# Patient Record
Sex: Female | Born: 1980 | Race: White | Hispanic: No | Smoking: Former smoker
Health system: Southern US, Community
[De-identification: ages and names within clinical notes are randomized; demographics above are authoritative.]

## PROBLEM LIST (undated history)

## (undated) DIAGNOSIS — D689 Coagulation defect, unspecified: Secondary | ICD-10-CM

## (undated) DIAGNOSIS — Z803 Family history of malignant neoplasm of breast: Secondary | ICD-10-CM

## (undated) DIAGNOSIS — K219 Gastro-esophageal reflux disease without esophagitis: Secondary | ICD-10-CM

## (undated) DIAGNOSIS — E063 Autoimmune thyroiditis: Secondary | ICD-10-CM

## (undated) DIAGNOSIS — Z8041 Family history of malignant neoplasm of ovary: Secondary | ICD-10-CM

## (undated) DIAGNOSIS — E079 Disorder of thyroid, unspecified: Secondary | ICD-10-CM

## (undated) DIAGNOSIS — F32A Depression, unspecified: Secondary | ICD-10-CM

## (undated) DIAGNOSIS — F419 Anxiety disorder, unspecified: Secondary | ICD-10-CM

## (undated) DIAGNOSIS — I10 Essential (primary) hypertension: Secondary | ICD-10-CM

## (undated) DIAGNOSIS — Z800 Family history of malignant neoplasm of digestive organs: Secondary | ICD-10-CM

## (undated) HISTORY — DX: Family history of malignant neoplasm of digestive organs: Z80.0

## (undated) HISTORY — DX: Coagulation defect, unspecified: D68.9

## (undated) HISTORY — DX: Depression, unspecified: F32.A

## (undated) HISTORY — DX: Anxiety disorder, unspecified: F41.9

## (undated) HISTORY — DX: Family history of malignant neoplasm of breast: Z80.3

## (undated) HISTORY — DX: Gastro-esophageal reflux disease without esophagitis: K21.9

## (undated) HISTORY — DX: Family history of malignant neoplasm of ovary: Z80.41

---

## 2005-07-28 ENCOUNTER — Emergency Department: Payer: Self-pay | Admitting: Emergency Medicine

## 2005-07-30 ENCOUNTER — Observation Stay: Payer: Self-pay | Admitting: Urology

## 2005-08-09 ENCOUNTER — Emergency Department: Payer: Self-pay | Admitting: Emergency Medicine

## 2012-01-28 ENCOUNTER — Emergency Department: Payer: Self-pay | Admitting: Emergency Medicine

## 2012-01-28 LAB — COMPREHENSIVE METABOLIC PANEL
Bilirubin,Total: 0.2 mg/dL (ref 0.2–1.0)
Calcium, Total: 9 mg/dL (ref 8.5–10.1)
Chloride: 103 mmol/L (ref 98–107)
Co2: 26 mmol/L (ref 21–32)
Creatinine: 0.85 mg/dL (ref 0.60–1.30)
EGFR (African American): >60
EGFR (Non-African Amer.): >60
Glucose: 108 mg/dL — ABNORMAL HIGH (ref 65–99)
Osmolality: 277 (ref 275–301)
Sodium: 138 mmol/L (ref 136–145)

## 2012-01-28 LAB — CBC
HGB: 13.4 g/dL (ref 12.0–16.0)
MCV: 93 fL (ref 80–100)
Platelet: 232 10*3/uL (ref 150–440)
RBC: 4.11 10*6/uL (ref 3.80–5.20)
RDW: 13.8 % (ref 11.5–14.5)
WBC: 10.6 10*3/uL (ref 3.6–11.0)

## 2012-01-28 LAB — CK TOTAL AND CKMB (NOT AT ARMC): CK, Total: 152 U/L (ref 21–215)

## 2012-01-28 LAB — URINALYSIS, COMPLETE
Bilirubin,UR: NEGATIVE
Glucose,UR: NEGATIVE mg/dL (ref 0–75)
Ketone: NEGATIVE
Leukocyte Esterase: NEGATIVE
Nitrite: NEGATIVE
Ph: 6 (ref 4.5–8.0)
Protein: NEGATIVE
RBC,UR: 1 /HPF (ref 0–5)
Squamous Epithelial: 2

## 2013-11-16 ENCOUNTER — Emergency Department: Payer: Self-pay | Admitting: Emergency Medicine

## 2013-11-16 LAB — CBC
HCT: 43.3 % (ref 35.0–47.0)
HGB: 14.1 g/dL (ref 12.0–16.0)
MCH: 29.2 pg (ref 26.0–34.0)
MCHC: 32.6 g/dL (ref 32.0–36.0)
MCV: 90 fL (ref 80–100)
PLATELETS: 254 10*3/uL (ref 150–440)
RBC: 4.83 10*6/uL (ref 3.80–5.20)
RDW: 13.2 % (ref 11.5–14.5)
WBC: 11 10*3/uL (ref 3.6–11.0)

## 2013-11-16 LAB — COMPREHENSIVE METABOLIC PANEL
ALT: 30 U/L
Albumin: 3.9 g/dL (ref 3.4–5.0)
Alkaline Phosphatase: 112 U/L
Anion Gap: 10 (ref 7–16)
BUN: 13 mg/dL (ref 7–18)
Bilirubin,Total: 0.4 mg/dL (ref 0.2–1.0)
CALCIUM: 9.5 mg/dL (ref 8.5–10.1)
CREATININE: 0.65 mg/dL (ref 0.60–1.30)
Chloride: 105 mmol/L (ref 98–107)
Co2: 22 mmol/L (ref 21–32)
GLUCOSE: 95 mg/dL (ref 65–99)
Osmolality: 274 (ref 275–301)
Potassium: 4 mmol/L (ref 3.5–5.1)
SGOT(AST): 13 U/L — ABNORMAL LOW (ref 15–37)
Sodium: 137 mmol/L (ref 136–145)
Total Protein: 7.9 g/dL (ref 6.4–8.2)

## 2013-11-16 LAB — TROPONIN I: Troponin-I: <0.02 ng/mL

## 2013-11-16 LAB — LIPASE, BLOOD: Lipase: 94 U/L (ref 73–393)

## 2015-08-05 ENCOUNTER — Encounter: Payer: Self-pay | Admitting: Emergency Medicine

## 2015-08-05 ENCOUNTER — Emergency Department: Payer: Managed Care, Other (non HMO)

## 2015-08-05 ENCOUNTER — Emergency Department
Admission: EM | Admit: 2015-08-05 | Discharge: 2015-08-05 | Disposition: A | Discharge status: Home / Self Care | Payer: Managed Care, Other (non HMO) | Attending: Emergency Medicine | Admitting: Emergency Medicine

## 2015-08-05 DIAGNOSIS — F17200 Nicotine dependence, unspecified, uncomplicated: Secondary | ICD-10-CM | POA: Diagnosis not present

## 2015-08-05 DIAGNOSIS — R519 Headache, unspecified: Secondary | ICD-10-CM

## 2015-08-05 DIAGNOSIS — R51 Headache: Secondary | ICD-10-CM | POA: Diagnosis not present

## 2015-08-05 DIAGNOSIS — I10 Essential (primary) hypertension: Secondary | ICD-10-CM | POA: Diagnosis not present

## 2015-08-05 HISTORY — DX: Disorder of thyroid, unspecified: E07.9

## 2015-08-05 HISTORY — DX: Essential (primary) hypertension: I10

## 2015-08-05 LAB — BASIC METABOLIC PANEL
Anion gap: 6 (ref 5–15)
BUN: 11 mg/dL (ref 6–20)
CHLORIDE: 105 mmol/L (ref 101–111)
CO2: 27 mmol/L (ref 22–32)
Calcium: 9.2 mg/dL (ref 8.9–10.3)
Creatinine, Ser: 0.58 mg/dL (ref 0.44–1.00)
Glucose, Bld: 111 mg/dL — ABNORMAL HIGH (ref 65–99)
POTASSIUM: 4.1 mmol/L (ref 3.5–5.1)
SODIUM: 138 mmol/L (ref 135–145)

## 2015-08-05 LAB — CBC
HCT: 41.8 % (ref 35.0–47.0)
Hemoglobin: 14.1 g/dL (ref 12.0–16.0)
MCH: 30.1 pg (ref 26.0–34.0)
MCHC: 33.8 g/dL (ref 32.0–36.0)
MCV: 89.1 fL (ref 80.0–100.0)
PLATELETS: 206 10*3/uL (ref 150–440)
RBC: 4.69 MIL/uL (ref 3.80–5.20)
RDW: 13.4 % (ref 11.5–14.5)
WBC: 9.2 10*3/uL (ref 3.6–11.0)

## 2015-08-05 LAB — URINALYSIS COMPLETE WITH MICROSCOPIC (ARMC ONLY)
BILIRUBIN URINE: NEGATIVE
Bacteria, UA: NONE SEEN
Glucose, UA: NEGATIVE mg/dL
KETONES UR: NEGATIVE mg/dL
Leukocytes, UA: NEGATIVE
Nitrite: NEGATIVE
Protein, ur: NEGATIVE mg/dL
Specific Gravity, Urine: 1.015 (ref 1.005–1.030)
WBC, UA: NONE SEEN WBC/hpf (ref 0–5)
pH: 6 (ref 5.0–8.0)

## 2015-08-05 LAB — PREGNANCY, URINE: Preg Test, Ur: NEGATIVE

## 2015-08-05 MED ORDER — DIPHENHYDRAMINE HCL 50 MG/ML IJ SOLN
25.0000 mg | Freq: Once | INTRAMUSCULAR | Status: AC
  Administered 2015-08-05: 25 mg via INTRAVENOUS
  Filled 2015-08-05: qty 1

## 2015-08-05 MED ORDER — SODIUM CHLORIDE 0.9 % IV BOLUS (SEPSIS)
500.0000 mL | Freq: Once | INTRAVENOUS | Status: AC
  Administered 2015-08-05: 500 mL via INTRAVENOUS

## 2015-08-05 MED ORDER — KETOROLAC TROMETHAMINE 30 MG/ML IJ SOLN
30.0000 mg | Freq: Once | INTRAMUSCULAR | Status: AC
  Administered 2015-08-05: 30 mg via INTRAVENOUS
  Filled 2015-08-05: qty 1

## 2015-08-05 NOTE — ED Provider Notes (Signed)
[facility] Emergency Department Provider Note   ____________________________________________  Time seen:  I have reviewed the triage vital signs and the triage nursing note.  HISTORY  Chief Complaint Headache   Historian Patient  HPI Kelsey Elbert EwingsL Lelon PerlaSaunders is a 35 y.o. female is here for complaint of hyper or pressure and headaches. She states that she's been having some frequent headaches over the past couple weeks, this seemed to be associated with high blood pressure. She states it starts with dizziness and then she checks her blood pressure and high in the 180 systolic range. Headaches are global and frontal in nature. She states occasionally she feels a little off balance, but no weakness or numbness confusion or altered mental status now.  She is followed for hypothyroid and hypertension with her primary care physician. She states that she takes labetalol at home.  Pain is mild to moderate not associated with any specific alleviating or exacerbating factors. Denies major stressors.    Past Medical History  Diagnosis Date  . Thyroid disease   . Hypertension     There are no active problems to display for this patient.   History reviewed. No pertinent past surgical history.  No current outpatient prescriptions on file. Labetalol, thyroid medication  Allergies Review of patient's allergies indicates no known allergies.  History reviewed. No pertinent family history.  Social History Social History  Substance Use Topics  . Smoking status: Current Every Day Smoker  . Smokeless tobacco: None  . Alcohol Use: Yes    Review of Systems  Constitutional: Negative for fever. Eyes: Negative for visual changes. ENT: Negative for sore throat. Cardiovascular: Negative for chest pain. Respiratory: Negative for shortness of breath. Gastrointestinal: Negative for abdominal pain, vomiting and diarrhea. Genitourinary: Negative for dysuria. Musculoskeletal:  Negative for back pain. Skin: Negative for rash. Neurological: Positive for headache. 10 point Review of Systems otherwise negative ____________________________________________   PHYSICAL EXAM:  VITAL SIGNS: ED Triage Vitals  Enc Vitals Group     BP 08/05/15 1036 155/99 mmHg     Pulse Rate 08/05/15 1036 77     Resp 08/05/15 1036 20     Temp 08/05/15 1036 98 F (36.7 C)     Temp Source 08/05/15 1036 Oral     SpO2 08/05/15 1036 100 %     Weight 08/05/15 1036 225 lb (102.059 kg)     Height 08/05/15 1036 5\' 8"  (1.727 m)     Head Cir --      Peak Flow --      Pain Score 08/05/15 1036 7     Pain Loc --      Pain Edu? --      Excl. in GC? --      Constitutional: Alert and oriented. Well appearing and in no distress. HEENT   Head: Normocephalic and atraumatic.      Eyes: Conjunctivae are normal. PERRL. Normal extraocular movements.      Ears:         Nose: No congestion/rhinnorhea.   Mouth/Throat: Mucous membranes are moist.   Neck: No stridor. Cardiovascular/Chest: Normal rate, regular rhythm.  No murmurs, rubs, or gallops. Respiratory: Normal respiratory effort without tachypnea nor retractions. Breath sounds are clear and equal bilaterally. No wheezes/rales/rhonchi. Gastrointestinal: Soft. No distention, no guarding, no rebound. Nontender.    Genitourinary/rectal:Deferred Musculoskeletal: Nontender with normal range of motion in all extremities. No joint effusions.  No lower extremity tenderness.  No edema. Neurologic:  Normal speech and language. No gross  or focal neurologic deficits are appreciated. Skin:  Skin is warm, dry and intact. No rash noted. Psychiatric: Mood and affect are normal. Speech and behavior are normal. Patient exhibits appropriate insight and judgment.  ____________________________________________   EKG I, Governor Rooks, MD, the attending physician have personally viewed and interpreted all ECGs.  70 bpm. Normal sinus rhythm. Narrow QRS.  Normal axis. Normal ST and T-wave ____________________________________________  LABS (pertinent positives/negatives)  BNP within normal limits CBC within normal limits Urinalysis without significant abnormality Urine pregnancy test negative  ____________________________________________  RADIOLOGY All Xrays were viewed by me. Imaging interpreted by Radiologist.  CT without contrast: Normal CT of the brain without intravenous contrast __________________________________________  PROCEDURES  Procedure(s) performed: None  Critical Care performed: None  ____________________________________________   ED COURSE / ASSESSMENT AND PLAN  Pertinent labs & imaging results that were available during my care of the patient were reviewed by me and considered in my medical decision making (see chart for details).  Patient's here for complaint of headache with high blood pressure, her blood pressures are a little elevated here but not severely so. It's unclear to me whether or not she is having spikes of high blood pressure that cause her symptomatic headaches, or alternatively if she's having headaches that are causing elevated blood pressures. Patient herself thinks that she's having spikes of high blood pressure and I discussed with her she could take an extra blood pressure pill although I'm a little concerned about creating hypotension given that she is reporting at times normal blood pressure of 120/80.  In terms of today, she's not having a focal neurologic finding on exam, but reports occasional dizziness and I discussed neuroimaging either today with head CT with the risk versus benefit ratio discussed, versus outpatient MRI of the brain.  Patient states she does want proceed with head CT today. She denies having prior neuroimaging in the past.  I treated her with IV Toradol, Benadryl, and IV fluids. After this she was symptomatically improved. Her blood pressures down to 120/80. Her  head CT is reassuring and unremarkable.  Patient is okay for outpatient discharge and follow-up at this point time.   CONSULTATIONS:   None   Patient / Family / Caregiver informed of clinical course, medical decision-making process, and agree with plan.   I discussed return precautions, follow-up instructions, and discharged instructions with patient and/or family.   ___________________________________________   FINAL CLINICAL IMPRESSION(S) / ED DIAGNOSES   Final diagnoses:  Acute nonintractable headache, unspecified headache type              Note: This dictation was prepared with Dragon dictation. Any transcriptional errors that result from this process are unintentional   Governor Rooks, MD 08/05/15 1624

## 2015-08-05 NOTE — Discharge Instructions (Signed)
You were evaluated for headache and although no certain cause was found, your exam and evaluation are reassuring in the emergency department stay.  Return to the emergency department for any worsening condition including confusion or altered mental status, blood pressure greater than 240/120, weakness, numbness, or any other symptoms concerning to you.  We discussed you may try 600 mg ibuprofen and 25 mg of Benadryl over-the-counter at onset of headache. Alternatively, we discussed she might try an extra dose of your blood pressure medication if your blood pressure is greater than 180 systolic/top number.   General Headache Without Cause A headache is pain or discomfort felt around the head or neck area. There are many causes and types of headaches. In some cases, the cause may not be found.  HOME CARE  Managing Pain  Take over-the-counter and prescription medicines only as told by your doctor.  Lie down in a dark, quiet room when you have a headache.  If directed, apply ice to the head and neck area:  Put ice in a plastic bag.  Place a towel between your skin and the bag.  Leave the ice on for 20 minutes, 2-3 times per day.  Use a heating pad or hot shower to apply heat to the head and neck area as told by your doctor.  Keep lights dim if bright lights bother you or make your headaches worse. Eating and Drinking  Eat meals on a regular schedule.  Lessen how much alcohol you drink.  Lessen how much caffeine you drink, or stop drinking caffeine. General Instructions  Keep all follow-up visits as told by your doctor. This is important.  Keep a journal to find out if certain things bring on headaches. For example, write down:  What you eat and drink.  How much sleep you get.  Any change to your diet or medicines.  Relax by getting a massage or doing other relaxing activities.  Lessen stress.  Sit up straight. Do not tighten (tense) your muscles.  Do not use tobacco  products. This includes cigarettes, chewing tobacco, or e-cigarettes. If you need help quitting, ask your doctor.  Exercise regularly as told by your doctor.  Get enough sleep. This often means 7-9 hours of sleep. GET HELP IF:  Your symptoms are not helped by medicine.  You have a headache that feels different than the other headaches.  You feel sick to your stomach (nauseous) or you throw up (vomit).  You have a fever. GET HELP RIGHT AWAY IF:   Your headache becomes really bad.  You keep throwing up.  You have a stiff neck.  You have trouble seeing.  You have trouble speaking.  You have pain in the eye or ear.  Your muscles are weak or you lose muscle control.  You lose your balance or have trouble walking.  You feel like you will pass out (faint) or you pass out.  You have confusion.   This information is not intended to replace advice given to you by your health care provider. Make sure you discuss any questions you have with your health care provider.   Document Released: 12/16/2007 Document Revised: 11/27/2014 Document Reviewed: 07/01/2014 Elsevier Interactive Patient Education [employer]2016 Elsevier Inc.

## 2015-08-05 NOTE — ED Notes (Signed)
Pt to ed with c/o headache, blurred vision, dizziness since yesterday.  Pt states checked her bp at work today and it was 144/100

## 2015-08-05 NOTE — ED Notes (Signed)
Patient sleeping soundly

## 2015-08-05 NOTE — ED Notes (Signed)
See triage note. Patient c/o headache that began yesterday to frontal lobe. Patient states she has h/o HTN and has been taking medications as directed, continues to be mildly hypertensive in ER.

## 2017-07-29 ENCOUNTER — Other Ambulatory Visit: Payer: Self-pay

## 2017-07-29 ENCOUNTER — Emergency Department
Admission: EM | Admit: 2017-07-29 | Discharge: 2017-07-29 | Disposition: A | Discharge status: Home / Self Care | Payer: 59 | Attending: Emergency Medicine | Admitting: Emergency Medicine

## 2017-07-29 ENCOUNTER — Encounter: Payer: Self-pay | Admitting: Emergency Medicine

## 2017-07-29 DIAGNOSIS — R51 Headache: Secondary | ICD-10-CM | POA: Diagnosis present

## 2017-07-29 DIAGNOSIS — Z87891 Personal history of nicotine dependence: Secondary | ICD-10-CM | POA: Insufficient documentation

## 2017-07-29 DIAGNOSIS — I10 Essential (primary) hypertension: Secondary | ICD-10-CM | POA: Insufficient documentation

## 2017-07-29 DIAGNOSIS — R519 Headache, unspecified: Secondary | ICD-10-CM

## 2017-07-29 MED ORDER — PROCHLORPERAZINE EDISYLATE 10 MG/2ML IJ SOLN
10.0000 mg | Freq: Once | INTRAMUSCULAR | Status: AC
  Administered 2017-07-29: 10 mg via INTRAVENOUS
  Filled 2017-07-29: qty 2

## 2017-07-29 MED ORDER — SODIUM CHLORIDE 0.9 % IV BOLUS
1000.0000 mL | Freq: Once | INTRAVENOUS | Status: AC
  Administered 2017-07-29: 1000 mL via INTRAVENOUS

## 2017-07-29 NOTE — Discharge Instructions (Addendum)
Please seek medical attention for any high fevers, chest pain, shortness of breath, change in behavior, persistent vomiting, bloody stool or any other new or concerning symptoms.  

## 2017-07-29 NOTE — ED Triage Notes (Signed)
Pt to ED via POV c/o headache and hypertension. Pt states that she has had a headache since yesterday. Pt denies missing any of her blood pressure medications. Pt is not currently hypertensive in triage. Pt states that she has also had some numbness across her forehead and down the right side of her face as well as changes in vision in the right eye. No facial droop noted. Pt states that the numbness and visual changes started yesterday. Pt is in NAD at this time.

## 2017-07-29 NOTE — ED Provider Notes (Signed)
[facility] Emergency Department Provider Note _________________________________________   I have reviewed the triage vital signs and the nursing notes.   HISTORY  Chief Complaint Headache and Hypertension   History limited by: Not Limited   HPI Kelsey Reed is a 37 y.o. female who presents to the emergency department today with complaints of headache and high blood pressure. Patient states it started yesterday when she felt like her blood pressure was increasing. She felt lightheaded and developed a right sided headache. Checked her blood pressure at the time and it showed systolic blood pressure in the 140s and diastolic in the 100s. She states that since then the headache has been constant. She tried tylenol and ibuprofen at home without any significant relief. She also has complaints of right sided facial numbness, bilateral forehead numbness, right sided blurry vision and right sided weakness. She denies any trauma to her head.   Per medical record review patient has a history of ED visit for headache in the past with negative neuroimaging at that time.   Past Medical History:  Diagnosis Date  . Hypertension   . Thyroid disease     There are no active problems to display for this patient.   History reviewed. No pertinent surgical history.  Prior to Admission medications   Not on File    Allergies Patient has no known allergies.  No family history on file.  Social History Social History   Tobacco Use  . Smoking status: Former Games developer  . Smokeless tobacco: Never Used  Substance Use Topics  . Alcohol use: Yes    Alcohol/week: 7.2 oz    Types: 12 Cans of beer per week  . Drug use: No    Review of Systems Constitutional: No fever/chills Eyes: Positive for right sided blurry vision. ENT: No sore throat. Cardiovascular: Denies chest pain. Respiratory: Denies shortness of breath. Gastrointestinal: No abdominal pain.  No nausea, no  vomiting.  No diarrhea.   Genitourinary: Negative for dysuria. Musculoskeletal: Negative for back pain. Skin: Negative for rash. Neurological: Positive for headache, right sided weakness, right facial numbness, bilateral forehead numbness ____________________________________________   PHYSICAL EXAM:  VITAL SIGNS: ED Triage Vitals [07/29/17 1424]  Enc Vitals Group     BP (!) 114/99     Pulse Rate 83     Resp 16     Temp 98.4 F (36.9 C)     Temp Source Oral     SpO2 99 %     Weight 235 lb (106.6 kg)     Height  (1.727 m)     Head Circumference      Peak Flow      Pain Score 6   Constitutional: Alert and oriented. Well appearing and in no distress. Eyes: Conjunctivae are normal.  ENT   Head: Normocephalic and atraumatic.   Nose: No congestion/rhinnorhea.   Mouth/Throat: Mucous membranes are moist.   Neck: No stridor. Hematological/Lymphatic/Immunilogical: No cervical lymphadenopathy. Cardiovascular: Normal rate, regular rhythm.  No murmurs, rubs, or gallops. Respiratory: Normal respiratory effort without tachypnea nor retractions. Breath sounds are clear and equal bilaterally. No wheezes/rales/rhonchi. Gastrointestinal: Soft and non tender. No rebound. No guarding.  Genitourinary: Deferred Musculoskeletal: Normal range of motion in all extremities. No lower extremity edema. Neurologic:  Normal speech and language. Tongue midline. Bilateral palate elevation. Subjective decrease in sensation to light touch over bilateral forehead and right face. Face symmetric. Strength 4+/5 in right upper and lower extremities.  Skin:  Skin is warm,  dry and intact. No rash noted. Psychiatric: Mood and affect are normal. Speech and behavior are normal. Patient exhibits appropriate insight and judgment.  ____________________________________________    LABS (pertinent  positives/negatives)  None  ____________________________________________   EKG  None  ____________________________________________    RADIOLOGY  None  ____________________________________________   PROCEDURES  Procedures  ____________________________________________   INITIAL IMPRESSION / ASSESSMENT AND PLAN / ED COURSE  Pertinent labs & imaging results that were available during my care of the patient were reviewed by me and considered in my medical decision making (see chart for details).  Patient presented to the emergency department today with concerns for bad headache as well as some numbness.  Given the distribution of the numbness there would be very unlikely to be due to a stroke given bilateral forehead.  Additionally be a large stroke given complete right-sided numbness.  However I doubt stroke.  Do think more likely complicated headache and migraine.  Patient did feel better after Compazine and said that her numbness was improving as well.  She did feel comfortable and desired to be discharged home.  Will give patient neurology follow-up for headaches.  ____________________________________________   FINAL CLINICAL IMPRESSION(S) / ED DIAGNOSES  Final diagnoses:  Bad headache  Hypertension, unspecified type     Note: This dictation was prepared with Dragon dictation. Any transcriptional errors that result from this process are unintentional     Phineas Semen, MD 07/29/17 1700

## 2017-11-04 DIAGNOSIS — F329 Major depressive disorder, single episode, unspecified: Secondary | ICD-10-CM | POA: Insufficient documentation

## 2017-11-04 DIAGNOSIS — N920 Excessive and frequent menstruation with regular cycle: Secondary | ICD-10-CM | POA: Insufficient documentation

## 2019-01-02 DIAGNOSIS — Z975 Presence of (intrauterine) contraceptive device: Secondary | ICD-10-CM | POA: Insufficient documentation

## 2019-01-15 ENCOUNTER — Emergency Department: Payer: BC Managed Care – PPO

## 2019-01-15 ENCOUNTER — Other Ambulatory Visit: Payer: Self-pay

## 2019-01-15 ENCOUNTER — Emergency Department
Admission: EM | Admit: 2019-01-15 | Discharge: 2019-01-15 | Disposition: A | Discharge status: Home / Self Care | Payer: BC Managed Care – PPO | Attending: Emergency Medicine | Admitting: Emergency Medicine

## 2019-01-15 ENCOUNTER — Encounter: Payer: Self-pay | Admitting: Emergency Medicine

## 2019-01-15 DIAGNOSIS — Z87891 Personal history of nicotine dependence: Secondary | ICD-10-CM | POA: Insufficient documentation

## 2019-01-15 DIAGNOSIS — R519 Headache, unspecified: Secondary | ICD-10-CM | POA: Diagnosis present

## 2019-01-15 DIAGNOSIS — R079 Chest pain, unspecified: Secondary | ICD-10-CM | POA: Diagnosis not present

## 2019-01-15 DIAGNOSIS — I10 Essential (primary) hypertension: Secondary | ICD-10-CM | POA: Diagnosis not present

## 2019-01-15 HISTORY — DX: Autoimmune thyroiditis: E06.3

## 2019-01-15 LAB — CBC
HCT: 41.6 % (ref 36.0–46.0)
Hemoglobin: 13.8 g/dL (ref 12.0–15.0)
MCH: 29.8 pg (ref 26.0–34.0)
MCHC: 33.2 g/dL (ref 30.0–36.0)
MCV: 89.8 fL (ref 80.0–100.0)
Platelets: 261 10*3/uL (ref 150–400)
RBC: 4.63 MIL/uL (ref 3.87–5.11)
RDW: 12.5 % (ref 11.5–15.5)
WBC: 10.4 10*3/uL (ref 4.0–10.5)
nRBC: 0 % (ref 0.0–0.2)

## 2019-01-15 LAB — BASIC METABOLIC PANEL
Anion gap: 12 (ref 5–15)
BUN: 11 mg/dL (ref 6–20)
CO2: 21 mmol/L — ABNORMAL LOW (ref 22–32)
Calcium: 9.3 mg/dL (ref 8.9–10.3)
Chloride: 104 mmol/L (ref 98–111)
Creatinine, Ser: 0.56 mg/dL (ref 0.44–1.00)
GFR calc Af Amer: >60 mL/min (ref >60)
GFR calc non Af Amer: >60 mL/min (ref >60)
Glucose, Bld: 97 mg/dL (ref 70–99)
Potassium: 4.2 mmol/L (ref 3.5–5.1)
Sodium: 137 mmol/L (ref 135–145)

## 2019-01-15 LAB — TROPONIN I (HIGH SENSITIVITY)
Troponin I (High Sensitivity): 3 ng/L (ref <18)
Troponin I (High Sensitivity): <2 ng/L (ref <18)

## 2019-01-15 MED ORDER — SODIUM CHLORIDE 0.9% FLUSH: 3.0000 mL | Freq: Once | INTRAVENOUS | Status: DC

## 2019-01-15 MED ORDER — BUTALBITAL-APAP-CAFFEINE 50-325-40 MG PO TABS
1.0000 | ORAL_TABLET | Freq: Once | ORAL | Status: AC
  Administered 2019-01-15: 1 via ORAL
  Filled 2019-01-15: qty 1

## 2019-01-15 MED ORDER — BUTALBITAL-APAP-CAFFEINE 50-325-40 MG PO TABS: 1.0000 | ORAL_TABLET | Freq: Four times a day (QID) | ORAL | 0 refills | Status: AC | PRN

## 2019-01-15 MED ORDER — LIDOCAINE VISCOUS HCL 2 % MT SOLN
15.0000 mL | Freq: Once | OROMUCOSAL | Status: AC
  Administered 2019-01-15: 15 mL via OROMUCOSAL
  Filled 2019-01-15: qty 15

## 2019-01-15 NOTE — ED Provider Notes (Signed)
[facility] Emergency Department Provider Note   ____________________________________________   I have reviewed the triage vital signs and the nursing notes.   HISTORY  Chief Complaint Chest Pain and Headache   History limited by: Not Limited   HPI Kelsey Reed is a 38 y.o. female who presents to the emergency department today with complaints of headache and chest pain.  Patient states that headache started roughly 3 or 4 days ago.  Has been constant.  She describes it as being somewhat severe.  Starting yesterday evening she developed chest pain.  She describes it being located in the center and left chest.  She describes it as sharp.  It does radiate to her back.  Has been constant.  She has had some slight associated shortness of breath.  She denies any cough.  Denies pain similar to this in the past.  No recent significant alcohol use or over-the-counter pain medication use.  Patient denies any recent travel.  No history of blood clots.  Patient has Mirena IUD.   Records reviewed. Per medical record review patient has a history of hypertension, thyroid disease.   Past Medical History:  Diagnosis Date  . Hashimoto's disease   . Hypertension   . Thyroid disease     There are no active problems to display for this patient.   History reviewed. No pertinent surgical history.  Prior to Admission medications   Not on File    Allergies Patient has no known allergies.  History reviewed. No pertinent family history.  Social History Social History   Tobacco Use  . Smoking status: Former Research scientist (life sciences)  . Smokeless tobacco: Never Used  Substance Use Topics  . Alcohol use: Yes    Alcohol/week: 12.0 standard drinks    Types: 12 Cans of beer per week  . Drug use: No    Review of Systems Constitutional: No fever/chills Eyes: No visual changes. ENT: No sore throat. Cardiovascular: Positive for chest pain. Respiratory: Positive for shortness of  breath. Gastrointestinal: No abdominal pain.  No nausea, no vomiting.  No diarrhea.   Genitourinary: Negative for dysuria. Musculoskeletal: Negative for back pain. Skin: Negative for rash. Neurological: Positive for headache.  ____________________________________________   PHYSICAL EXAM:  VITAL SIGNS: ED Triage Vitals [01/15/19 1136]  Enc Vitals Group     BP (!) 149/93     Pulse Rate 89     Resp 18     Temp 98.2 F (36.8 C)     Temp Source Oral     SpO2 97 %     Weight 253 lb (114.8 kg)     Height 5\' 8"  (1.727 m)     Head Circumference      Peak Flow      Pain Score 5   Constitutional: Alert and oriented.  Eyes: Conjunctivae are normal.  ENT      Head: Normocephalic and atraumatic.      Nose: No congestion/rhinnorhea.      Mouth/Throat: Mucous membranes are moist.      Neck: No stridor. Hematological/Lymphatic/Immunilogical: No cervical lymphadenopathy. Cardiovascular: Normal rate, regular rhythm.  No murmurs, rubs, or gallops.  Respiratory: Normal respiratory effort without tachypnea nor retractions. Breath sounds are clear and equal bilaterally. No wheezes/rales/rhonchi. Gastrointestinal: Soft and non tender. No rebound. No guarding.  Genitourinary: Deferred Musculoskeletal: Normal range of motion in all extremities. No lower extremity edema. Neurologic:  Normal speech and language. No gross focal neurologic deficits are appreciated.  Skin:  Skin is warm,  dry and intact. No rash noted. Psychiatric: Mood and affect are normal. Speech and behavior are normal. Patient exhibits appropriate insight and judgment.  ____________________________________________    LABS (pertinent positives/negatives)  Trop 3 -> <2 BMP wnl except co2 21 CBC wbc 10.4, hgb 13.8, plt 261  ____________________________________________   EKG  I, Phineas Semen, attending physician, personally viewed and interpreted this EKG  EKG Time: 1135 Rate: 83 Rhythm: normal sinus rhythm Axis:  normal Intervals: qtc 439 QRS: narrow ST changes: no st elevation, t wave inversion III, aVF Impression: abnormal ekg  Previous EKGs reviewed, EKGs dated from 2017 and 2015 with t wave inversion in III, t wave flattening in aVF  ____________________________________________    RADIOLOGY  CXR No active disease  ____________________________________________   PROCEDURES  Procedures  ____________________________________________   INITIAL IMPRESSION / ASSESSMENT AND PLAN / ED COURSE  Pertinent labs & imaging results that were available during my care of the patient were reviewed by me and considered in my medical decision making (see chart for details).   Patient presented to the emergency department today because of concern for chest pain and headache. In terms of the chest pain, CXR without pna or ptx. Troponin negative times 2. Doubt PE given lack of tachycardia, cough or hypoxia. Headache did improve with medication. Patient did felt comfortable and requested discharge after headache resolved. I do think this is reasonable. Will have patient follow up.   ____________________________________________   FINAL CLINICAL IMPRESSION(S) / ED DIAGNOSES  Final diagnoses:  Bad headache  Nonspecific chest pain     Note: This dictation was prepared with Dragon dictation. Any transcriptional errors that result from this process are unintentional     Phineas Semen, MD 01/15/19 [phone]

## 2019-01-15 NOTE — ED Triage Notes (Addendum)
Brought by [facility] for htn, chest pain (since last night)  Dizziness and headache.

## 2019-01-15 NOTE — ED Triage Notes (Signed)
Pt presents to ED via wheelchair from [facility] with c/o L sided chest pain that radiates to her L shoulder/neck. Pt also c/o HA and dizziness that started last night. Pt states was at work, had them check her BP, and her BP was 144/100 at work.

## 2019-01-15 NOTE — Discharge Instructions (Addendum)
Please seek medical attention for any high fevers, chest pain, shortness of breath, change in behavior, persistent vomiting, bloody stool or any other new or concerning symptoms.  

## 2020-05-29 ENCOUNTER — Other Ambulatory Visit: Payer: Self-pay

## 2020-05-29 ENCOUNTER — Emergency Department
Admission: EM | Admit: 2020-05-29 | Discharge: 2020-05-29 | Disposition: A | Discharge status: Home / Self Care | Payer: Managed Care, Other (non HMO) | Attending: Emergency Medicine | Admitting: Emergency Medicine

## 2020-05-29 ENCOUNTER — Emergency Department: Payer: Managed Care, Other (non HMO)

## 2020-05-29 ENCOUNTER — Encounter: Payer: Self-pay | Admitting: Emergency Medicine

## 2020-05-29 DIAGNOSIS — R630 Anorexia: Secondary | ICD-10-CM | POA: Insufficient documentation

## 2020-05-29 DIAGNOSIS — I10 Essential (primary) hypertension: Secondary | ICD-10-CM | POA: Insufficient documentation

## 2020-05-29 DIAGNOSIS — R1013 Epigastric pain: Secondary | ICD-10-CM | POA: Diagnosis not present

## 2020-05-29 DIAGNOSIS — R109 Unspecified abdominal pain: Secondary | ICD-10-CM | POA: Diagnosis present

## 2020-05-29 DIAGNOSIS — Z87891 Personal history of nicotine dependence: Secondary | ICD-10-CM | POA: Diagnosis not present

## 2020-05-29 DIAGNOSIS — N12 Tubulo-interstitial nephritis, not specified as acute or chronic: Secondary | ICD-10-CM

## 2020-05-29 LAB — CBC WITH DIFFERENTIAL/PLATELET
Abs Immature Granulocytes: 0.08 10*3/uL — ABNORMAL HIGH (ref 0.00–0.07)
Basophils Absolute: 0.1 10*3/uL (ref 0.0–0.1)
Basophils Relative: 1 %
Eosinophils Absolute: 0.4 10*3/uL (ref 0.0–0.5)
Eosinophils Relative: 4 %
HCT: 41.4 % (ref 36.0–46.0)
Hemoglobin: 14.4 g/dL (ref 12.0–15.0)
Immature Granulocytes: 1 %
Lymphocytes Relative: 30 %
Lymphs Abs: 3 10*3/uL (ref 0.7–4.0)
MCH: 31.2 pg (ref 26.0–34.0)
MCHC: 34.8 g/dL (ref 30.0–36.0)
MCV: 89.6 fL (ref 80.0–100.0)
Monocytes Absolute: 0.6 10*3/uL (ref 0.1–1.0)
Monocytes Relative: 6 %
Neutro Abs: 5.8 10*3/uL (ref 1.7–7.7)
Neutrophils Relative %: 58 %
Platelets: 228 10*3/uL (ref 150–400)
RBC: 4.62 MIL/uL (ref 3.87–5.11)
RDW: 12.9 % (ref 11.5–15.5)
WBC: 9.9 10*3/uL (ref 4.0–10.5)
nRBC: 0 % (ref 0.0–0.2)

## 2020-05-29 LAB — URINALYSIS, COMPLETE (UACMP) WITH MICROSCOPIC
Bacteria, UA: NONE SEEN
Bilirubin Urine: NEGATIVE
Glucose, UA: NEGATIVE mg/dL
Ketones, ur: NEGATIVE mg/dL
Nitrite: NEGATIVE
Protein, ur: NEGATIVE mg/dL
Specific Gravity, Urine: 1.017 (ref 1.005–1.030)
pH: 6 (ref 5.0–8.0)

## 2020-05-29 LAB — COMPREHENSIVE METABOLIC PANEL
ALT: 15 U/L (ref 0–44)
AST: 19 U/L (ref 15–41)
Albumin: 3.9 g/dL (ref 3.5–5.0)
Alkaline Phosphatase: 76 U/L (ref 38–126)
Anion gap: 7 (ref 5–15)
BUN: 20 mg/dL (ref 6–20)
CO2: 23 mmol/L (ref 22–32)
Calcium: 8.8 mg/dL — ABNORMAL LOW (ref 8.9–10.3)
Chloride: 108 mmol/L (ref 98–111)
Creatinine, Ser: 0.74 mg/dL (ref 0.44–1.00)
GFR, Estimated: >60 mL/min (ref >60)
Glucose, Bld: 118 mg/dL — ABNORMAL HIGH (ref 70–99)
Potassium: 4.8 mmol/L (ref 3.5–5.1)
Sodium: 138 mmol/L (ref 135–145)
Total Bilirubin: 0.8 mg/dL (ref 0.3–1.2)
Total Protein: 7 g/dL (ref 6.5–8.1)

## 2020-05-29 LAB — POC URINE PREG, ED: Preg Test, Ur: NEGATIVE

## 2020-05-29 LAB — LIPASE, BLOOD: Lipase: 39 U/L (ref 11–51)

## 2020-05-29 MED ORDER — DIPHENHYDRAMINE HCL 50 MG/ML IJ SOLN
25.0000 mg | Freq: Once | INTRAMUSCULAR | Status: AC
  Administered 2020-05-29: 25 mg via INTRAVENOUS

## 2020-05-29 MED ORDER — ONDANSETRON 4 MG PO TBDP: 4.0000 mg | ORAL_TABLET | Freq: Three times a day (TID) | ORAL | 0 refills | Status: DC | PRN

## 2020-05-29 MED ORDER — DIPHENHYDRAMINE HCL 50 MG/ML IJ SOLN
INTRAMUSCULAR | Status: AC
  Filled 2020-05-29: qty 1

## 2020-05-29 MED ORDER — SODIUM CHLORIDE 0.9 % IV BOLUS
1000.0000 mL | Freq: Once | INTRAVENOUS | Status: AC
  Administered 2020-05-29: 1000 mL via INTRAVENOUS

## 2020-05-29 MED ORDER — CEPHALEXIN 500 MG PO CAPS: 500.0000 mg | ORAL_CAPSULE | Freq: Three times a day (TID) | ORAL | 0 refills | Status: AC

## 2020-05-29 MED ORDER — LIDOCAINE VISCOUS HCL 2 % MT SOLN
15.0000 mL | Freq: Once | OROMUCOSAL | Status: AC
  Administered 2020-05-29: 15 mL via ORAL
  Filled 2020-05-29: qty 15

## 2020-05-29 MED ORDER — HALOPERIDOL LACTATE 5 MG/ML IJ SOLN
2.0000 mg | Freq: Once | INTRAMUSCULAR | Status: AC
  Administered 2020-05-29: 2 mg via INTRAVENOUS
  Filled 2020-05-29: qty 1

## 2020-05-29 MED ORDER — IBUPROFEN 600 MG PO TABS: 600.0000 mg | ORAL_TABLET | Freq: Three times a day (TID) | ORAL | 0 refills | Status: DC | PRN

## 2020-05-29 MED ORDER — KETOROLAC TROMETHAMINE 30 MG/ML IJ SOLN
15.0000 mg | Freq: Once | INTRAMUSCULAR | Status: AC
  Administered 2020-05-29: 15 mg via INTRAVENOUS
  Filled 2020-05-29: qty 1

## 2020-05-29 MED ORDER — SODIUM CHLORIDE 0.9 % IV SOLN
2.0000 g | Freq: Once | INTRAVENOUS | Status: AC
  Administered 2020-05-29: 2 g via INTRAVENOUS
  Filled 2020-05-29: qty 20

## 2020-05-29 MED ORDER — FENTANYL CITRATE (PF) 100 MCG/2ML IJ SOLN
50.0000 ug | Freq: Once | INTRAMUSCULAR | Status: AC
  Administered 2020-05-29: 50 ug via INTRAVENOUS
  Filled 2020-05-29: qty 2

## 2020-05-29 MED ORDER — ALUM & MAG HYDROXIDE-SIMETH 200-200-20 MG/5ML PO SUSP
30.0000 mL | Freq: Once | ORAL | Status: AC
  Administered 2020-05-29: 30 mL via ORAL
  Filled 2020-05-29: qty 30

## 2020-05-29 MED ORDER — PROMETHAZINE HCL 25 MG RE SUPP: 25.0000 mg | Freq: Three times a day (TID) | RECTAL | 0 refills | Status: DC | PRN

## 2020-05-29 MED ORDER — ONDANSETRON HCL 4 MG/2ML IJ SOLN
4.0000 mg | Freq: Once | INTRAMUSCULAR | Status: AC
  Administered 2020-05-29: 4 mg via INTRAVENOUS
  Filled 2020-05-29: qty 2

## 2020-05-29 NOTE — ED Triage Notes (Signed)
Patient ambulatory to triage with steady gait, without difficulty or distress noted; pt reports rt sided abd pain radiating into side since Tues accomp by nausea

## 2020-05-29 NOTE — ED Provider Notes (Signed)
Assumed care from Dr. Derrill Kay at 7 AM. Briefly, the patient is a 40 y.o. female with PMHx of  has a past medical history of Hashimoto's disease, Hypertension, and Thyroid disease. here with RUQ pain, nausea, vomiting. Pt is s/p COVID in February. Reports mild RUQ pain, nausea, vomiting. No RLQ TTP. No fevers. Labs show normal WBC, normal LFTs. UA pending.   Labs Reviewed  CBC WITH DIFFERENTIAL/PLATELET - Abnormal; Notable for the following components:      Result Value   Abs Immature Granulocytes 0.08 (*)    All other components within normal limits  COMPREHENSIVE METABOLIC PANEL - Abnormal; Notable for the following components:   Glucose, Bld 118 (*)    Calcium 8.8 (*)    All other components within normal limits  URINALYSIS, COMPLETE (UACMP) WITH MICROSCOPIC  POC URINE PREG, ED    Course of Care: Urinalysis shows possible UTI.  She does have a history of this.  She feels better after symptom control and has been given Rocephin, Toradol.  Will discharge with antibiotics, antiemetics, and supportive care.  Work note provided.  Patient tolerating p.o. and otherwise well-appearing.  She is hemodynamically stable.     Shaune Pollack, MD 05/29/20 [phone]

## 2020-05-29 NOTE — ED Notes (Signed)
ED Provider at bedside. 

## 2020-05-29 NOTE — ED Provider Notes (Signed)
[facility] Emergency Department Provider Note   ____________________________________________   I have reviewed the triage vital signs and the nursing notes.   HISTORY  Chief Complaint Abdominal Pain   History limited by: Not Limited   HPI Kelsey Reed is a 40 y.o. female who presents to the emergency department today because of concerns for abdominal pain.  Patient states that the pain started 2 days ago.  Located in her right abdomen.  She states it is now moving around more to her right back.  Is located in the upper part of her abdomen.  Has been somewhat constant.  She has had decreased appetite since the pain started.  She has eaten although that does not make the pain worse.  She has had some looser stool.  Patient denies any fevers.  Denies similar pain in the past.  She did try taking an antacid without any relief.   Records reviewed. Per medical record review patient has a history of HTN.   Past Medical History:  Diagnosis Date  . Hashimoto's disease   . Hypertension   . Thyroid disease     There are no problems to display for this patient.   History reviewed. No pertinent surgical history.  Prior to Admission medications   Not on File    Allergies Patient has no known allergies.  No family history on file.  Social History Social History   Tobacco Use  . Smoking status: Former Games developer  . Smokeless tobacco: Never Used  Vaping Use  . Vaping Use: Never used  Substance Use Topics  . Alcohol use: Yes    Alcohol/week: 12.0 standard drinks    Types: 12 Cans of beer per week  . Drug use: No    Review of Systems Constitutional: No fever/chills Eyes: No visual changes. ENT: No sore throat. Cardiovascular: Denies chest pain. Respiratory: Denies shortness of breath. Gastrointestinal: Positive for abdominal pain.  Genitourinary: Negative for dysuria. Musculoskeletal: Negative for back pain. Skin: Negative for  rash. Neurological: Negative for headaches, focal weakness or numbness.  ____________________________________________   PHYSICAL EXAM:  VITAL SIGNS: ED Triage Vitals  Enc Vitals Group     BP 05/29/20 0614 (!) 144/100     Pulse Rate 05/29/20 0614 85     Resp 05/29/20 0614 20     Temp 05/29/20 0614 98 F (36.7 C)     Temp Source 05/29/20 0614 Oral     SpO2 05/29/20 0614 95 %     Weight 05/29/20 0609 247 lb (112 kg)     Height 05/29/20 0609 5\' 8"  (1.727 m)     Head Circumference --      Peak Flow --      Pain Score 05/29/20 0609 10   Constitutional: Alert and oriented.  Eyes: Conjunctivae are normal.  ENT      Head: Normocephalic and atraumatic.      Nose: No congestion/rhinnorhea.      Mouth/Throat: Mucous membranes are moist.      Neck: No stridor. Hematological/Lymphatic/Immunilogical: No cervical lymphadenopathy. Cardiovascular: Normal rate, regular rhythm.  No murmurs, rubs, or gallops.  Respiratory: Normal respiratory effort without tachypnea nor retractions. Breath sounds are clear and equal bilaterally. No wheezes/rales/rhonchi. Gastrointestinal: Soft and tender to palpation in the right upper quadrant.   Genitourinary: Deferred Musculoskeletal: Normal range of motion in all extremities. No lower extremity edema. Neurologic:  Normal speech and language. No gross focal neurologic deficits are appreciated.  Skin:  Skin is warm, dry  and intact. No rash noted. Psychiatric: Mood and affect are normal. Speech and behavior are normal. Patient exhibits appropriate insight and judgment.  ____________________________________________    LABS (pertinent positives/negatives)  CBC wbc 9.9, hgb 14.4, plt 228 CMP wnl except glu 118, ca 8.8  ____________________________________________   EKG  None  ____________________________________________    RADIOLOGY  RUQ [place]  pending  ____________________________________________   PROCEDURES  Procedures  ____________________________________________   INITIAL IMPRESSION / ASSESSMENT AND PLAN / ED COURSE  Pertinent labs & imaging results that were available during my care of the patient were reviewed by me and considered in my medical decision making (see chart for details).   Patient presents to the emergency department today because of concerns for right-sided abdominal pain that is improved assistant for the past 2 days.  On exam she does have tenderness to the abdomen worse in the right upper quadrant.  I do have concerns for possible gallbladder disease given location of the pain.  Will obtain ultrasound.  Additionally will give patient medication to try to help manage her pain and fluids. ____________________________________________   FINAL CLINICAL IMPRESSION(S) / ED DIAGNOSES  Final diagnoses:  Epigastric abdominal pain     Note: This dictation was prepared with Dragon dictation. Any transcriptional errors that result from this process are unintentional     Phineas Semen, MD 05/29/20 [phone]

## 2020-05-29 NOTE — ED Notes (Signed)
Pt checked for pain re-evaluation and patient states she feels an overwhelming amount of anxiety after that last medication as well as being hot.  MD updated, see MAR.

## 2020-06-01 LAB — URINE CULTURE: Culture: >=100000 — AB

## 2020-06-09 DIAGNOSIS — E063 Autoimmune thyroiditis: Secondary | ICD-10-CM | POA: Insufficient documentation

## 2020-09-12 ENCOUNTER — Other Ambulatory Visit: Payer: Self-pay

## 2020-09-12 ENCOUNTER — Emergency Department (HOSPITAL_COMMUNITY): Payer: 59

## 2020-09-12 ENCOUNTER — Emergency Department (HOSPITAL_COMMUNITY)
Admission: EM | Admit: 2020-09-12 | Discharge: 2020-09-12 | Disposition: A | Discharge status: Home / Self Care | Payer: 59 | Attending: Emergency Medicine | Admitting: Emergency Medicine

## 2020-09-12 ENCOUNTER — Encounter (HOSPITAL_COMMUNITY): Payer: Self-pay | Admitting: Emergency Medicine

## 2020-09-12 DIAGNOSIS — M549 Dorsalgia, unspecified: Secondary | ICD-10-CM | POA: Diagnosis not present

## 2020-09-12 DIAGNOSIS — M25512 Pain in left shoulder: Secondary | ICD-10-CM | POA: Insufficient documentation

## 2020-09-12 DIAGNOSIS — R0789 Other chest pain: Secondary | ICD-10-CM | POA: Insufficient documentation

## 2020-09-12 DIAGNOSIS — R079 Chest pain, unspecified: Secondary | ICD-10-CM | POA: Diagnosis present

## 2020-09-12 DIAGNOSIS — I10 Essential (primary) hypertension: Secondary | ICD-10-CM | POA: Insufficient documentation

## 2020-09-12 DIAGNOSIS — Z87891 Personal history of nicotine dependence: Secondary | ICD-10-CM | POA: Diagnosis not present

## 2020-09-12 LAB — COMPREHENSIVE METABOLIC PANEL
ALT: 18 U/L (ref 0–44)
AST: 17 U/L (ref 15–41)
Albumin: 4.3 g/dL (ref 3.5–5.0)
Alkaline Phosphatase: 76 U/L (ref 38–126)
Anion gap: 8 (ref 5–15)
BUN: 12 mg/dL (ref 6–20)
CO2: 25 mmol/L (ref 22–32)
Calcium: 9.2 mg/dL (ref 8.9–10.3)
Chloride: 102 mmol/L (ref 98–111)
Creatinine, Ser: 0.73 mg/dL (ref 0.44–1.00)
GFR, Estimated: >60 mL/min (ref >60)
Glucose, Bld: 111 mg/dL — ABNORMAL HIGH (ref 70–99)
Potassium: 3.7 mmol/L (ref 3.5–5.1)
Sodium: 135 mmol/L (ref 135–145)
Total Bilirubin: 1 mg/dL (ref 0.3–1.2)
Total Protein: 7.6 g/dL (ref 6.5–8.1)

## 2020-09-12 LAB — CBC WITH DIFFERENTIAL/PLATELET
Abs Immature Granulocytes: 0.07 10*3/uL (ref 0.00–0.07)
Basophils Absolute: 0.1 10*3/uL (ref 0.0–0.1)
Basophils Relative: 1 %
Eosinophils Absolute: 0.2 10*3/uL (ref 0.0–0.5)
Eosinophils Relative: 2 %
HCT: 42.4 % (ref 36.0–46.0)
Hemoglobin: 14.6 g/dL (ref 12.0–15.0)
Immature Granulocytes: 1 %
Lymphocytes Relative: 21 %
Lymphs Abs: 2.3 10*3/uL (ref 0.7–4.0)
MCH: 30.5 pg (ref 26.0–34.0)
MCHC: 34.4 g/dL (ref 30.0–36.0)
MCV: 88.7 fL (ref 80.0–100.0)
Monocytes Absolute: 0.5 10*3/uL (ref 0.1–1.0)
Monocytes Relative: 5 %
Neutro Abs: 7.5 10*3/uL (ref 1.7–7.7)
Neutrophils Relative %: 70 %
Platelets: 266 10*3/uL (ref 150–400)
RBC: 4.78 MIL/uL (ref 3.87–5.11)
RDW: 11.9 % (ref 11.5–15.5)
WBC: 10.6 10*3/uL — ABNORMAL HIGH (ref 4.0–10.5)
nRBC: 0 % (ref 0.0–0.2)

## 2020-09-12 LAB — TROPONIN I (HIGH SENSITIVITY): Troponin I (High Sensitivity): <2 ng/L (ref <18)

## 2020-09-12 MED ORDER — ACETAMINOPHEN 325 MG PO TABS
650.0000 mg | ORAL_TABLET | Freq: Once | ORAL | Status: AC
  Administered 2020-09-12: 650 mg via ORAL
  Filled 2020-09-12: qty 2

## 2020-09-12 NOTE — ED Provider Notes (Signed)
Emergency Medicine Provider Triage Evaluation Note  Kelsey Reed , a 40 y.o. female  was evaluated in triage.  Pt complains of chest pain.  Patient has a history of hypertension, thyroid disorder, and anxiety.  She states that her hypertension is typically well controlled and has even lost weight recently, l which she attributes to stress.  Her blood pressures have been elevated beyond her typical baseline despite her taking her medications regularly.  She has been having intermittent chest pain regularly, worse today which prompted her to come to the ED for evaluation.  Her CP is often associated with HA and numbness.  No history of CAD.  Denies possibility of pregnancy.  Review of Systems  Positive: Chest pain, HA, numbness, elevated blood pressures Negative: Fevers, chills, SOB  Physical Exam  BP 133/84 (BP Location: Right Arm)   Pulse 74   Temp (!) 97.3 F (36.3 C) (Oral)   Resp 18   SpO2 97%  Gen:   Awake, no distress   Resp:  Normal effort  MSK:   Moves extremities without difficulty  Other:    Medical Decision Making  Medically screening exam initiated at 12:47 PM.  Appropriate orders placed.  Kelsey Reed was informed that the remainder of the evaluation will be completed by another provider, this initial triage assessment does not replace that evaluation, and the importance of remaining in the ED until their evaluation is complete.     Lorelee New, PA-C 09/12/20 1253    Tilden Fossa, MD 09/12/20 1434

## 2020-09-12 NOTE — ED Triage Notes (Signed)
Pt here from work with c/o cp , no sob n/v . Pt has been under a lot stress lately , 324 mg asa and 1 nitro given , without change in pain

## 2020-09-12 NOTE — Discharge Instructions (Addendum)
Call your primary care doctor or specialist as discussed in the next 2-3 days.   Return immediately back to the ER if:  Your symptoms worsen within the next 12-24 hours. You develop new symptoms such as new fevers, persistent vomiting, new pain, shortness of breath, or new weakness or numbness, or if you have any other concerns.  

## 2020-09-12 NOTE — ED Provider Notes (Signed)
[facility] EMERGENCY DEPARTMENT Provider Note   CSN: [id number] Arrival date & time: 09/12/20  1225     History No chief complaint on file.   Kelsey Reed is a 40 y.o. female.  Patient presents chief complaint of mid to left-sided chest pain radiating back to her left shoulder.  She states she had the symptoms off and on for the past 2 to 3 weeks.  She states that nothing she does at home or at work seems to cause her symptoms but that it seems to come on and off on its own.  They typically last about 2 to 3 minutes and then resolved.  No associated palpitations or diaphoresis no shortness of breath no fever no cough no vomiting no diarrhea.  She had another episode today and decided come into the ER for evaluation.      Past Medical History:  Diagnosis Date   Hashimoto's disease    Hypertension    Thyroid disease     There are no problems to display for this patient.   History reviewed. No pertinent surgical history.   OB History   No obstetric history on file.     No family history on file.  Social History   Tobacco Use   Smoking status: Former    Pack years: 0.00   Smokeless tobacco: Never  Vaping Use   Vaping Use: Never used  Substance Use Topics   Alcohol use: Yes    Alcohol/week: 12.0 standard drinks    Types: 12 Cans of beer per week   Drug use: No    Home Medications Prior to Admission medications   Medication Sig Start Date End Date Taking? Authorizing Provider  ibuprofen (ADVIL) 600 MG tablet Take 1 tablet (600 mg total) by mouth every 8 (eight) hours as needed for moderate pain. 05/29/20   Shaune Pollack, MD  ondansetron (ZOFRAN ODT) 4 MG disintegrating tablet Take 1 tablet (4 mg total) by mouth every 8 (eight) hours as needed for nausea or vomiting. 05/29/20   Shaune Pollack, MD  promethazine (PHENERGAN) 25 MG suppository Place 1 suppository (25 mg total) rectally every 8 (eight) hours as needed for refractory nausea /  vomiting. 05/29/20   Shaune Pollack, MD    Allergies    Patient has no known allergies.  Review of Systems   Review of Systems  Constitutional:  Negative for fever.  HENT:  Negative for ear pain.   Eyes:  Negative for pain.  Respiratory:  Negative for cough.   Cardiovascular:  Positive for chest pain.  Gastrointestinal:  Negative for abdominal pain.  Genitourinary:  Negative for flank pain.  Musculoskeletal:  Negative for back pain.  Skin:  Negative for rash.  Neurological:  Negative for headaches.   Physical Exam Updated Vital Signs BP (!) 136/92   Pulse 63   Temp (!) 97.3 F (36.3 C) (Oral)   Resp 13   SpO2 99%   Physical Exam Constitutional:      General: She is not in acute distress.    Appearance: Normal appearance.  HENT:     Head: Normocephalic.     Nose: Nose normal.  Eyes:     Extraocular Movements: Extraocular movements intact.  Cardiovascular:     Rate and Rhythm: Normal rate.  Pulmonary:     Effort: Pulmonary effort is normal.  Musculoskeletal:        General: Normal range of motion.     Cervical back: Normal range of motion.  Comments: Mid to left chest wall tenderness with palpation reproduces her pain.  Neurological:     General: No focal deficit present.     Mental Status: She is alert. Mental status is at baseline.    ED Results / Procedures / Treatments   Labs (all labs ordered are listed, but only abnormal results are displayed) Labs Reviewed  CBC WITH DIFFERENTIAL/PLATELET - Abnormal; Notable for the following components:      Result Value   WBC 10.6 (*)    All other components within normal limits  COMPREHENSIVE METABOLIC PANEL - Abnormal; Notable for the following components:   Glucose, Bld 111 (*)    All other components within normal limits  TROPONIN I (HIGH SENSITIVITY)    EKG EKG Interpretation  Date/Time:  Friday September 12 2020 12:40:23 EDT Ventricular Rate:  78 PR Interval:  136 QRS Duration: 82 QT Interval:  370 QTC  Calculation: 421 R Axis:   68 Text Interpretation: Normal sinus rhythm Normal ECG Confirmed by Norman Clay ([id number]) on 09/12/2020 12:56:46 PM  Radiology DG Chest Portable 1 View  Result Date: 09/12/2020 CLINICAL DATA:  Left-sided chest pain EXAM: PORTABLE CHEST 1 VIEW COMPARISON:  01/15/2019 FINDINGS: The heart size and mediastinal contours are within normal limits. Both lungs are clear. The visualized skeletal structures are unremarkable. IMPRESSION: No acute abnormality of the lungs in AP portable projection. Electronically Signed   By: Lauralyn Primes M.D.   On: 09/12/2020 13:35    Procedures Procedures   Medications Ordered in ED Medications  acetaminophen (TYLENOL) tablet 650 mg (650 mg Oral Given 09/12/20 1357)    ED Course  I have reviewed the triage vital signs and the nursing notes.  Pertinent labs & imaging results that were available during my care of the patient were reviewed by me and considered in my medical decision making (see chart for details).    MDM Rules/Calculators/A&P                          EKG shows sinus rhythm normal rate.  No ST elevations or depressions noted.  Test this is unremarkable as well.  Labs show normal troponin.  Given symptoms been off and on for the past 2 weeks once her troponins were sent.  Patient's presentation is very atypical.  Did consider pulmonary embolism and acute coronary syndrome however feel these are less likely given presentation and work-up today.  Recommending outpatient follow-up with cardiologist within the week.  Recommend immediate return for worsening symptoms or any additional concerns.  Final Clinical Impression(s) / ED Diagnoses Final diagnoses:  Chest pain, unspecified type    Rx / DC Orders ED Discharge Orders     None        Cheryll Cockayne, MD 09/12/20 [phone]

## 2020-09-16 ENCOUNTER — Other Ambulatory Visit: Payer: Self-pay

## 2020-09-16 ENCOUNTER — Ambulatory Visit: Payer: 59 | Admitting: Cardiovascular Disease

## 2020-09-16 ENCOUNTER — Encounter: Payer: Self-pay | Admitting: Cardiovascular Disease

## 2020-09-16 VITALS — BP 150/92 | HR 83 | Ht 68.0 in | Wt 241.0 lb

## 2020-09-16 DIAGNOSIS — R0789 Other chest pain: Secondary | ICD-10-CM | POA: Diagnosis not present

## 2020-09-16 DIAGNOSIS — Z79899 Other long term (current) drug therapy: Secondary | ICD-10-CM

## 2020-09-16 DIAGNOSIS — I10 Essential (primary) hypertension: Secondary | ICD-10-CM

## 2020-09-16 DIAGNOSIS — R072 Precordial pain: Secondary | ICD-10-CM | POA: Diagnosis not present

## 2020-09-16 MED ORDER — ATENOLOL 50 MG PO TABS: 50.0000 mg | ORAL_TABLET | Freq: Every day | ORAL | 3 refills | Status: DC

## 2020-09-16 NOTE — Assessment & Plan Note (Signed)
History of essential hypertension which she has had since the birth of her daughter although it has gotten worse since she had COVID back in February.  She is on atenolol and hydrochlorothiazide blood pressure measured today at 150/92.  She does take her blood pressure at home and it is elevated there as well.  I am going to increase her atenolol from 25 to 50 mg a day.  Of asked to keep a 30-day blood pressure log.  She will be seen by a Pharm.D. in 4 weeks to review make further recommendations.  She may need addition of a calcium channel blocker or an ARB.

## 2020-09-16 NOTE — Patient Instructions (Addendum)
Medication Instructions:  Increase Atenolol to 50 mg daily  Please keep a log of your blood pressure daily.  *If you need a refill on your cardiac medications before your next appointment, please call your pharmacy*   Lab Work: Your physician recommends that you return for lab work (fasting lipid/LFT/BMP).  If you have labs (blood work) drawn today and your tests are completely normal, you will receive your results only by: MyChart Message (if you have MyChart) OR A paper copy in the mail If you have any lab test that is abnormal or we need to change your treatment, we will call you to review the results.   Testing/Procedures: Your physician has requested that you have an echocardiogram. Echocardiography is a painless test that uses sound waves to create images of your heart. It provides your doctor with information about the size and shape of your heart and how well your heart's chambers and valves are working. This procedure takes approximately one hour. There are no restrictions for this procedure. [address]  Cardiac CT Angiography (CTA), is a special type of CT scan that uses a computer to produce multi-dimensional views of major blood vessels throughout the body. In CT angiography, a contrast material is injected through an IV to help visualize the blood vessels [facility]    Follow-Up: At [facility], you and your health needs are our priority.  As part of our continuing mission to provide you with exceptional heart care, we have created designated Provider Care Teams.  These Care Teams include your primary Cardiologist (physician) and Advanced Practice Providers (APPs -  Physician Assistants and Nurse Practitioners) who all work together to provide you with the care you need, when you need it.  We recommend signing up for the patient portal called "MyChart".  Sign up information is provided on this After Visit Summary.  MyChart is used to connect  with patients for Virtual Visits (Telemedicine).  Patients are able to view lab/test results, encounter notes, upcoming appointments, etc.  Non-urgent messages can be sent to your provider as well.   To learn more about what you can do with MyChart, go to [email or web address].    Your next appointment:   1 month(s)  The format for your next appointment:   In Person  Provider:   Nanetta Batty, MD  Your physician recommends that you schedule a follow-up appointment in 1 month with Pharm D for Hy[facility].     Your cardiac CT will be scheduled at one of the below locations:   [facility] [address] [place], [place] [address] [phone]  If scheduled at [facility], please arrive at the [facility] main entrance (entrance A) of [facility] 30 minutes prior to test start time. Proceed to the [facility] Radiology Department (first floor) to check-in and test prep.  If scheduled at [facility], please arrive 15 mins early for check-in and test prep.  Please follow these instructions carefully (unless otherwise directed):  On the Night Before the Test: Be sure to Drink plenty of water. Do not consume any caffeinated/decaffeinated beverages or chocolate 12 hours prior to your test. Do not take any antihistamines 12 hours prior to your test.  On the Day of the Test: Drink plenty of water until 1 hour prior to the test. Do not eat any food 4 hours prior to the test. You may take your regular medications prior to the test.  Take Atenolol 100 mg two hours prior to test. HOLD Hydrochlorothiazide morning of the test. FEMALES- please wear underwire-free bra if available       After the Test: Drink plenty of water. After receiving IV contrast, you may experience a mild flushed feeling. This is normal. On occasion, you may experience a mild rash up to 24 hours after the test. This is not dangerous. If this occurs,  you can take Benadryl 25 mg and increase your fluid intake. If you experience trouble breathing, this can be serious. If it is severe call 911 IMMEDIATELY. If it is mild, please call our office. If you take any of these medications: Glipizide/Metformin, Avandament, Glucavance, please do not take 48 hours after completing test unless otherwise instructed.   Once we have confirmed authorization from your insurance company, we will call you to set up a date and time for your test. Based on how quickly your insurance processes prior authorizations requests, please allow up to 4 weeks to be contacted for scheduling your Cardiac CT appointment. Be advised that routine Cardiac CT appointments could be scheduled as many as 8 weeks after your provider has ordered it.  For non-scheduling related questions, please contact the cardiac imaging nurse navigator should you have any questions/concerns: Rockwell Alexandria, Cardiac Imaging Nurse Navigator Larey Brick, Cardiac Imaging Nurse Navigator [facility] Direct Office Dial: [phone]   For scheduling needs, including cancellations and rescheduling, please call [place], [phone].

## 2020-09-16 NOTE — Progress Notes (Signed)
09/16/2020 Kelsey Reed   02/17/1981  [id number]  Primary Physician Pricilla Holm, NP (Inactive) Primary Cardiologist: Runell Gess MD Nicholes Calamity, [place]  HPI:  Kelsey Reed is a 40 y.o. moderately overweight recently divorced Caucasian female mother of 3 children who works as a Engineer, site at [employer].  She was referred by the emergency room where she saw Dr. Audley Hose on 09/12/2020 with atypical chest pain.  Her only risk factor is treated hypertension which she is had since the birth of her daughter.  The hypertension has been more difficult to control since she contracted COVID back in February.  She does check her blood pressure at home.  Over the last 3 weeks she is noticed increasing frequency of substernal chest pain which radiates to her back shoulder and neck.  She was seen in the ER for this as well with unremarkable work-up.   Current Meds  Medication Sig   albuterol (VENTOLIN HFA) 108 (90 Base) MCG/ACT inhaler INHALE 2 PUFFS BY MOUTH EVERY 4 TO 6 HOURS AS DIRECTED AS NEEDED   atenolol (TENORMIN) 25 MG tablet Take 25 mg by mouth daily.   escitalopram (LEXAPRO) 20 MG tablet Take by mouth.   hydrochlorothiazide (HYDRODIURIL) 12.5 MG tablet 12.5 mg.   hydrOXYzine (ATARAX/VISTARIL) 10 MG tablet    levothyroxine (SYNTHROID) 175 MCG tablet    ondansetron (ZOFRAN ODT) 4 MG disintegrating tablet Take 1 tablet (4 mg total) by mouth every 8 (eight) hours as needed for nausea or vomiting.     No Known Allergies  Social History   Socioeconomic History   Marital status: Divorced    Spouse name: Not on file   Number of children: Not on file   Years of education: Not on file   Highest education level: Not on file  Occupational History   Not on file  Tobacco Use   Smoking status: Former    Pack years: 0.00   Smokeless tobacco: Never  Vaping Use   Vaping Use: Never used  Substance and Sexual Activity   Alcohol use: Yes     Alcohol/week: 12.0 standard drinks    Types: 12 Cans of beer per week   Drug use: No   Sexual activity: Not on file  Other Topics Concern   Not on file  Social History Narrative   Not on file   Social Determinants of Health   Financial Resource Strain: Not on file  Food Insecurity: Not on file  Transportation Needs: Not on file  Physical Activity: Not on file  Stress: Not on file  Social Connections: Not on file  Intimate Partner Violence: Not on file     Review of Systems: General: negative for chills, fever, night sweats or weight changes.  Cardiovascular: negative for chest pain, dyspnea on exertion, edema, orthopnea, palpitations, paroxysmal nocturnal dyspnea or shortness of breath Dermatological: negative for rash Respiratory: negative for cough or wheezing Urologic: negative for hematuria Abdominal: negative for nausea, vomiting, diarrhea, bright red blood per rectum, melena, or hematemesis Neurologic: negative for visual changes, syncope, or dizziness All other systems reviewed and are otherwise negative except as noted above.    Blood pressure (!) 150/92, pulse 83, height 5\' 8"  (1.727 m), weight 241 lb (109.3 kg).  General appearance: alert and no distress Neck: no adenopathy, no carotid bruit, no JVD, supple, symmetrical, trachea midline, and thyroid not enlarged, symmetric, no tenderness/mass/nodules Lungs: clear to auscultation bilaterally Heart: regular rate and  rhythm, S1, S2 normal, no murmur, click, rub or gallop Extremities: extremities normal, atraumatic, no cyanosis or edema Pulses: 2+ and symmetric Skin: Skin color, texture, turgor normal. No rashes or lesions Neurologic: Grossly normal  EKG sinus rhythm at 83 without ST or T wave changes.  I personally reviewed this EKG.  ASSESSMENT AND PLAN:   Essential hypertension History of essential hypertension which she has had since the birth of her daughter although it has gotten worse since she had COVID  back in February.  She is on atenolol and hydrochlorothiazide blood pressure measured today at 150/92.  She does take her blood pressure at home and it is elevated there as well.  I am going to increase her atenolol from 25 to 50 mg a day.  Of asked to keep a 30-day blood pressure log.  She will be seen by a Pharm.D. in 4 weeks to review make further recommendations.  She may need addition of a calcium channel blocker or an ARB.  Atypical chest pain Ms. Moskal has had atypical chest pain for the last 3 weeks or so.  It occurs off and on but is progressively gotten worse.  She was seen in the emergency room on 09/12/2020 with negative work-up.  She really has no risk factors other than hypertension.  The pain radiates to her back neck and shoulder as well.  She is a normal EKG.  I am going to get a coronary CTA to further evaluate.     Runell Gess MD FACP,FACC,FAHA, [facility] 09/16/2020 2:16 PM

## 2020-09-16 NOTE — Assessment & Plan Note (Signed)
Kelsey Reed has had atypical chest pain for the last 3 weeks or so.  It occurs off and on but is progressively gotten worse.  She was seen in the emergency room on 09/12/2020 with negative work-up.  She really has no risk factors other than hypertension.  The pain radiates to her back neck and shoulder as well.  She is a normal EKG.  I am going to get a coronary CTA to further evaluate.

## 2020-09-24 ENCOUNTER — Telehealth (HOSPITAL_COMMUNITY): Payer: Self-pay | Admitting: *Deleted

## 2020-09-24 ENCOUNTER — Other Ambulatory Visit (HOSPITAL_COMMUNITY): Payer: Self-pay | Admitting: *Deleted

## 2020-09-24 MED ORDER — METOPROLOL TARTRATE 100 MG PO TABS: ORAL_TABLET | ORAL | 0 refills | Status: DC

## 2020-09-24 NOTE — Telephone Encounter (Signed)
Reaching out to patient to offer assistance regarding upcoming cardiac imaging study; pt verbalizes understanding of appt date/time, parking situation and where to check in, pre-test NPO status and medications ordered, and verified current allergies; name and call back number provided for further questions should they arise  Larey Brick RN Navigator Cardiac Imaging [facility] [phone] office [phone] cell  100mg  metoprolol tartrate called in for patient to take 2 hour prior to cardiac CT.

## 2020-09-25 ENCOUNTER — Other Ambulatory Visit: Payer: Self-pay

## 2020-09-25 ENCOUNTER — Ambulatory Visit
Admission: RE | Admit: 2020-09-25 | Discharge: 2020-09-25 | Disposition: A | Discharge status: Home / Self Care | Payer: 59 | Source: Ambulatory Visit | Attending: Cardiovascular Disease | Admitting: Cardiovascular Disease

## 2020-09-25 DIAGNOSIS — R072 Precordial pain: Secondary | ICD-10-CM | POA: Insufficient documentation

## 2020-09-25 MED ORDER — METOPROLOL TARTRATE 5 MG/5ML IV SOLN
10.0000 mg | Freq: Once | INTRAVENOUS | Status: AC
  Administered 2020-09-25: 10 mg via INTRAVENOUS

## 2020-09-25 MED ORDER — NITROGLYCERIN 0.4 MG SL SUBL
0.8000 mg | SUBLINGUAL_TABLET | Freq: Once | SUBLINGUAL | Status: AC
  Administered 2020-09-25: 0.8 mg via SUBLINGUAL

## 2020-09-25 MED ORDER — IOHEXOL 350 MG/ML SOLN
100.0000 mL | Freq: Once | INTRAVENOUS | Status: AC | PRN
  Administered 2020-09-25: 100 mL via INTRAVENOUS

## 2020-09-25 NOTE — Progress Notes (Signed)
Patient tolerated CT well. Drinking water sitting in chair. Vital signs stable encourage to drink water throughout day.Reasons explained and verbalized understanding. Ambulated steady gait.   

## 2020-10-08 ENCOUNTER — Ambulatory Visit (HOSPITAL_COMMUNITY): Payer: 59 | Attending: Cardiology

## 2020-10-08 ENCOUNTER — Other Ambulatory Visit: Payer: Self-pay

## 2020-10-08 DIAGNOSIS — R0789 Other chest pain: Secondary | ICD-10-CM | POA: Insufficient documentation

## 2020-10-08 LAB — ECHOCARDIOGRAM COMPLETE
Area-P 1/2: 2.92 cm2
S' Lateral: 2.4 cm

## 2020-10-08 MED ORDER — PERFLUTREN LIPID MICROSPHERE
1.0000 mL | INTRAVENOUS | Status: AC | PRN
  Administered 2020-10-08: 2 mL via INTRAVENOUS

## 2020-10-16 ENCOUNTER — Ambulatory Visit: Payer: 59

## 2020-10-16 ENCOUNTER — Telehealth: Payer: Self-pay

## 2020-10-16 NOTE — Telephone Encounter (Signed)
UNABLE TO LMOM TO R.S

## 2020-10-28 ENCOUNTER — Ambulatory Visit: Payer: 59 | Admitting: Cardiovascular Disease

## 2021-02-25 ENCOUNTER — Inpatient Hospital Stay: Payer: Self-pay | Attending: Oncology | Admitting: Licensed Clinical Social Worker

## 2021-02-25 ENCOUNTER — Inpatient Hospital Stay: Payer: Self-pay

## 2021-03-07 DIAGNOSIS — Z20822 Contact with and (suspected) exposure to covid-19: Secondary | ICD-10-CM | POA: Diagnosis not present

## 2021-03-09 DIAGNOSIS — Z03818 Encounter for observation for suspected exposure to other biological agents ruled out: Secondary | ICD-10-CM | POA: Diagnosis not present

## 2021-03-09 DIAGNOSIS — Z20822 Contact with and (suspected) exposure to covid-19: Secondary | ICD-10-CM | POA: Diagnosis not present

## 2021-04-24 DIAGNOSIS — N200 Calculus of kidney: Secondary | ICD-10-CM | POA: Diagnosis not present

## 2021-04-24 DIAGNOSIS — R399 Unspecified symptoms and signs involving the genitourinary system: Secondary | ICD-10-CM | POA: Diagnosis not present

## 2021-04-24 DIAGNOSIS — M545 Low back pain, unspecified: Secondary | ICD-10-CM | POA: Diagnosis not present

## 2021-05-27 ENCOUNTER — Encounter: Payer: Self-pay | Admitting: Licensed Clinical Social Worker

## 2021-05-27 ENCOUNTER — Inpatient Hospital Stay: Payer: 59 | Attending: Oncology | Admitting: Licensed Clinical Social Worker

## 2021-05-27 ENCOUNTER — Other Ambulatory Visit: Payer: Self-pay

## 2021-05-27 ENCOUNTER — Inpatient Hospital Stay: Payer: 59

## 2021-05-27 DIAGNOSIS — Z8041 Family history of malignant neoplasm of ovary: Secondary | ICD-10-CM

## 2021-05-27 DIAGNOSIS — Z800 Family history of malignant neoplasm of digestive organs: Secondary | ICD-10-CM | POA: Diagnosis not present

## 2021-05-27 DIAGNOSIS — Z803 Family history of malignant neoplasm of breast: Secondary | ICD-10-CM | POA: Diagnosis not present

## 2021-05-27 NOTE — Progress Notes (Signed)
REFERRING PROVIDER: ?Self-referred ? ?PRIMARY PROVIDER:  ?Lavonne Chick, NP (Inactive) ? ?PRIMARY REASON FOR VISIT:  ?1. Family history of breast cancer   ?2. Family history of ovarian cancer   ?3. Family history of colon cancer   ? ? ? ?HISTORY OF PRESENT ILLNESS:   ?Kelsey Reed, a 41 y.o. female, was seen for a [facility] cancer genetics consultation due to a family history of cancer.  Kelsey Reed presents to clinic today to discuss the possibility of a hereditary predisposition to cancer, genetic testing, and to further clarify her future cancer risks, as well as potential cancer risks for family members.  ? ?Kelsey Reed is a 41 y.o. female with no personal history of cancer.   ? ?CANCER HISTORY:  ?Oncology History  ? No history exists.  ? ? ? ?RISK FACTORS:  ?Menarche was at age 40.  ?First live birth at age 50  ?OCP use for short time ?Ovaries intact: yes.  ?Hysterectomy: no.  ?Menopausal status: premenopausal.  ?HRT use: 0 years. ?Colonoscopy: no; not examined. ?Mammogram within the last year: no. ?Number of breast biopsies: 0. ? ?Past Medical History:  ?Diagnosis Date  ? Family history of breast cancer   ? Family history of colon cancer   ? Family history of ovarian cancer   ? Hashimoto's disease   ? Hypertension   ? Thyroid disease   ? ? ?No past surgical history on file. ? ?Social History  ? ?Socioeconomic History  ? Marital status: Divorced  ?  Spouse name: Not on file  ? Number of children: Not on file  ? Years of education: Not on file  ? Highest education level: Not on file  ?Occupational History  ? Not on file  ?Tobacco Use  ? Smoking status: Former  ? Smokeless tobacco: Never  ?Vaping Use  ? Vaping Use: Never used  ?Substance and Sexual Activity  ? Alcohol use: Yes  ?  Alcohol/week: 12.0 standard drinks  ?  Types: 12 Cans of beer per week  ? Drug use: No  ? Sexual activity: Not on file  ?Other Topics Concern  ? Not on file  ?Social History Narrative  ? Not on file  ? ?Social Determinants of  Health  ? ?Financial Resource Strain: Not on file  ?Food Insecurity: Not on file  ?Transportation Needs: Not on file  ?Physical Activity: Not on file  ?Stress: Not on file  ?Social Connections: Not on file  ?  ? ?FAMILY HISTORY:  ?We obtained a detailed, 4-generation family history.  Significant diagnoses are listed below: ?Family History  ?Problem Relation Age of Onset  ? Colon cancer Maternal Uncle 66  ? Breast cancer Paternal Aunt   ? Breast cancer Paternal Aunt   ? Breast cancer Paternal Aunt   ? Ovarian cancer Maternal Grandmother 73  ? Breast cancer Paternal Grandmother   ?     dx under 48, metastatic recurrence d. 68  ? ?Kelsey Reed has 2 sons, 1 daughter, 2 sisters, no cancers.  ? ?Kelsey Reed's mother is living at 41. Patient has 2 maternal aunts, 2 uncles. One uncle was recently diagnosed with colon cancer in his late 69s. Maternal grandmother had ovarian cancer at 65. Grandfather died at 51. ? ?Kelsey Reed's father is living at 67. Patient has 3 paternal aunts, 3 paternal uncles. All three aunts have had breast cancer. No cancers for cousins. Grandmother had breast diagnosed initially under age 106 and then had a metastatic recurrence and passed at  47. Paternal grandfather died at 56. ? ?Kelsey Reed is unaware of previous family history of genetic testing for hereditary cancer risks. Patient's maternal ancestors are of [place], [place], [place] descent, and paternal ancestors are of [place], [place] descent. There is no reported Ashkenazi Jewish ancestry. There is no known consanguinity. ? ? ? ?GENETIC COUNSELING ASSESSMENT: Kelsey Reed is a 41 y.o. female with a family history of cancer which is somewhat suggestive of a hereditary cancer syndrome and predisposition to cancer. We, therefore, discussed and recommended the following at today's visit.  ? ?DISCUSSION: We discussed that approximately 10% of breast cancer is hereditary. Most cases of hereditary breast cancer are associated with BRCA1/BRCA2  genes, although there are other genes associated with hereditary cancer as well including Lynch syndrome which we discussed based on maternal family history. Cancers and risks are gene specific.  We discussed that testing is beneficial for several reasons including knowing about other cancer risks, identifying potential screening and risk-reduction options that may be appropriate, and to understand if other family members could be at risk for cancer and allow them to undergo genetic testing.  ? ?We reviewed the characteristics, features and inheritance patterns of hereditary cancer syndromes. We also discussed genetic testing, including the appropriate family members to test, the process of testing, insurance coverage and turn-around-time for results. We discussed the implications of a negative, positive and/or variant of uncertain significant result. We recommended Kelsey Reed pursue genetic testing for the [facility] CancerNext-Expanded+RNA gene panel.  ? ?The CancerNext-Expanded + RNAinsight gene panel offered by [employer] and includes sequencing and rearrangement analysis for the following 77 genes: IP, ALK, APC*, ATM*, AXIN2, BAP1, BARD1, BLM, BMPR1A, BRCA1*, BRCA2*, BRIP1*, CDC73, CDH1*,CDK4, CDKN1B, CDKN2A, CHEK2*, CTNNA1, DICER1, FANCC, FH, FLCN, GALNT12, KIF1B, LZTR1, MAX, MEN1, MET, MLH1*, MSH2*, MSH3, MSH6*, MUTYH*, NBN, NF1*, NF2, NTHL1, PALB2*, PHOX2B, PMS2*, POT1, PRKAR1A, PTCH1, PTEN*, RAD51C*, RAD51D*,RB1, RECQL, RET, SDHA, SDHAF2, SDHB, SDHC, SDHD, SMAD4, SMARCA4, SMARCB1, SMARCE1, STK11, SUFU, TMEM127, TP53*,TSC1, TSC2, VHL and XRCC2 (sequencing and deletion/duplication); EGFR, EGLN1, HOXB13, KIT, MITF, PDGFRA, POLD1 and POLE (sequencing only); EPCAM and GREM1 (deletion/duplication only). ? ?Based on Kelsey Reed's family history of cancer, she meets medical criteria for genetic testing. Despite that she meets criteria, she may still have an out of pocket cost. We discussed that if her out of  pocket cost for testing is over $100, the laboratory will call and confirm whether she wants to proceed with testing.  If the out of pocket cost of testing is less than $100 she will be billed by the genetic testing laboratory.  ? ?PLAN: After considering the risks, benefits, and limitations, Kelsey Reed provided informed consent to pursue genetic testing and the blood sample was sent to [facility] for analysis of the CancerNext-Expanded+RNA panel. Results should be available within approximately 2-3 weeks' time, at which point they will be disclosed by telephone to Kelsey Reed, as will any additional recommendations warranted by these results. Kelsey Reed will receive a summary of her genetic counseling visit and a copy of her results once available. This information will also be available in Epic.  ? ?Kelsey Reed questions were answered to her satisfaction today. Our contact information was provided should additional questions or concerns arise. Thank you for the referral and allowing [place] to share in the care of your patient.  ? ?Faith Rogue, MS, LCGC ?Genetic Counselor ?[email or web address] ?Phone: [phone] ? ?The patient was seen for a total of 15 minutes in face-to-face genetic counseling.  Dr. Grayland Ormond  was available for discussion regarding this case.  ? ?_______________________________________________________________________ ?For Office Staff:  ?Number of people involved in session: 1 ?Was an Intern/ student involved with case: no ? ?

## 2021-06-03 DIAGNOSIS — R7309 Other abnormal glucose: Secondary | ICD-10-CM | POA: Diagnosis not present

## 2021-06-03 DIAGNOSIS — B3732 Chronic candidiasis of vulva and vagina: Secondary | ICD-10-CM | POA: Diagnosis not present

## 2021-06-03 DIAGNOSIS — E039 Hypothyroidism, unspecified: Secondary | ICD-10-CM | POA: Diagnosis not present

## 2021-06-03 DIAGNOSIS — I10 Essential (primary) hypertension: Secondary | ICD-10-CM | POA: Diagnosis not present

## 2021-06-03 DIAGNOSIS — Z0130 Encounter for examination of blood pressure without abnormal findings: Secondary | ICD-10-CM | POA: Diagnosis not present

## 2021-06-04 DIAGNOSIS — R7309 Other abnormal glucose: Secondary | ICD-10-CM | POA: Diagnosis not present

## 2021-06-04 DIAGNOSIS — E039 Hypothyroidism, unspecified: Secondary | ICD-10-CM | POA: Diagnosis not present

## 2021-06-16 ENCOUNTER — Encounter: Payer: Self-pay | Admitting: Licensed Clinical Social Worker

## 2021-06-16 ENCOUNTER — Ambulatory Visit: Payer: Self-pay | Admitting: Licensed Clinical Social Worker

## 2021-06-16 ENCOUNTER — Telehealth: Payer: Self-pay | Admitting: Licensed Clinical Social Worker

## 2021-06-16 DIAGNOSIS — Z1379 Encounter for other screening for genetic and chromosomal anomalies: Secondary | ICD-10-CM

## 2021-06-16 NOTE — Telephone Encounter (Signed)
Revealed negative genetic testing.  Revealed that a VUS in MAX was identified. This normal result is reassuring. It is unlikely that there is an increased risk of cancer due to a mutation in one of these genes.  However, genetic testing is not perfect, and cannot definitively rule out a hereditary cause.  It will be important for her to keep in contact with genetics to learn if any additional testing may be needed in the future.    ? ?

## 2021-06-16 NOTE — Progress Notes (Signed)
HPI:  Kelsey Reed was previously seen in the [facility] clinic due to a family history of cancer and concerns regarding a hereditary predisposition to cancer. Please refer to our prior cancer genetics clinic note for more information regarding our discussion, assessment and recommendations, at the time. Kelsey Reed recent genetic test results were disclosed to her, as were recommendations warranted by these results. These results and recommendations are discussed in more detail below. ? ?CANCER HISTORY:  ?Oncology History  ? No history exists.  ? ? ?FAMILY HISTORY:  ?We obtained a detailed, 4-generation family history.  Significant diagnoses are listed below: ?Family History  ?Problem Relation Age of Onset  ? Colon cancer Maternal Uncle 66  ? Breast cancer Paternal Aunt   ? Breast cancer Paternal Aunt   ? Breast cancer Paternal Aunt   ? Ovarian cancer Maternal Grandmother 35  ? Breast cancer Paternal Grandmother   ?     dx under 83, metastatic recurrence d. 51  ? ?[name] has 2 sons, 1 daughter, 2 sisters, no cancers.  ?  ?[name]'s mother is living at 31. Patient has 2 maternal aunts, 2 uncles. One uncle was recently diagnosed with colon cancer in his late 30s. Maternal grandmother had ovarian cancer at 5. Grandfather died at 81. ?  ?[name]'s father is living at 44. Patient has 3 paternal aunts, 3 paternal uncles. All three aunts have had breast cancer. No cancers for cousins. Grandmother had breast diagnosed initially under age 62 and then had a metastatic recurrence and passed at 105. Paternal grandfather died at 2. ?  ?[name] is unaware of previous family history of genetic testing for hereditary cancer risks. Patient's maternal ancestors are of [place], [place], [place] descent, and paternal ancestors are of [place], [place] descent. There is no reported Ashkenazi Jewish ancestry. There is no known consanguinity. ?  ? ? ? ?GENETIC TEST RESULTS: Genetic testing reported  out on 06/10/2021 through the [facility] CancerNext-Expanded+RNA cancer panel found no pathogenic mutations.  ? ?The CancerNext-Expanded + RNAinsight gene panel offered by [employer] and includes sequencing and rearrangement analysis for the following 77 genes: IP, ALK, APC*, ATM*, AXIN2, BAP1, BARD1, BLM, BMPR1A, BRCA1*, BRCA2*, BRIP1*, CDC73, CDH1*,CDK4, CDKN1B, CDKN2A, CHEK2*, CTNNA1, DICER1, FANCC, FH, FLCN, GALNT12, KIF1B, LZTR1, MAX, MEN1, MET, MLH1*, MSH2*, MSH3, MSH6*, MUTYH*, NBN, NF1*, NF2, NTHL1, PALB2*, PHOX2B, PMS2*, POT1, PRKAR1A, PTCH1, PTEN*, RAD51C*, RAD51D*,RB1, RECQL, RET, SDHA, SDHAF2, SDHB, SDHC, SDHD, SMAD4, SMARCA4, SMARCB1, SMARCE1, STK11, SUFU, TMEM127, TP53*,TSC1, TSC2, VHL and XRCC2 (sequencing and deletion/duplication); EGFR, EGLN1, HOXB13, KIT, MITF, PDGFRA, POLD1 and POLE (sequencing only); EPCAM and GREM1 (deletion/duplication only).  ? ?The test report has been scanned into EPIC and is located under the Molecular Pathology section of the Results Review tab.  A portion of the result report is included below for reference.  ? ? ? ?We discussed that because current genetic testing is not perfect, it is possible there may be a gene mutation in one of these genes that current testing cannot detect, but that chance is small.  There could be another gene that has not yet been discovered, or that we have not yet tested, that is responsible for the cancer diagnoses in the family. It is also possible there is a hereditary cause for the cancer in the family that [name] did not inherit and therefore was not identified in her testing.  Therefore, it is important to remain in touch with cancer genetics in the future so that  we can continue to offer [name] the most up to date genetic testing.  ? ?Genetic testing did identify a variant of uncertain significance (VUS) in the MAX gene called [id number].  At this time, it is unknown if this variant is associated with increased cancer risk or if  this is a normal finding, but most variants such as this get reclassified to being inconsequential. It should not be used to make medical management decisions. With time, we suspect the lab will determine the significance of this variant, if any. If we do learn more about it we will try to contact Ms. Segers to discuss it further. However, it is important to stay in touch with [place] periodically and keep the address and phone number up to date. ? ?ADDITIONAL GENETIC TESTING: We discussed with [name] that her genetic testing was fairly extensive.  If there are genes identified to increase cancer risk that can be analyzed in the future, we would be happy to discuss and coordinate this testing at that time.   ? ?CANCER SCREENING RECOMMENDATIONS: Ms. Liese test result is considered negative (normal).  This means that we have not identified a hereditary cause for her family history of cancer at this time.  ? ?While reassuring, this does not definitively rule out a hereditary predisposition to cancer. It is still possible that there could be genetic mutations that are undetectable by current technology. There could be genetic mutations in genes that have not been tested or identified to increase cancer risk.  Therefore, it is recommended she continue to follow the cancer management and screening guidelines provided by her primary healthcare provider.  ? ?An individual's cancer risk and medical management are not determined by genetic test results alone. Overall cancer risk assessment incorporates additional factors, including personal medical history, family history, and any available genetic information that may result in a personalized plan for cancer prevention and surveillance. ? ?Based on [name]'s personal and family history of cancer as well as her genetic test results, risk model [name] was used to estimate her risk of developing breast cancer. This estimates her lifetime risk of developing  breast cancer to be approximately 19.4%.  The patient's lifetime breast cancer risk is a preliminary estimate based on available information using one of several models endorsed by the [employer]  (NCCN). The NCCN recommends consideration of breast MRI screening as an adjunct to mammography for patients at high risk (defined as 20% or greater lifetime risk).  This risk estimate can change over time and may be repeated to reflect new information in her personal or family history in the future. ? ? ? ?RECOMMENDATIONS FOR FAMILY MEMBERS:  Relatives in this family might be at some increased risk of developing cancer, over the general population risk, simply due to the family history of cancer.  We recommended female relatives in this family have a yearly mammogram beginning at age 42, or 35 years younger than the earliest onset of cancer, an annual clinical breast exam, and perform monthly breast self-exams. Female relatives in this family should also have a gynecological exam as recommended by their primary provider.  All family members should be referred for colonoscopy starting at age 61.  ? ? It is also possible there is a hereditary cause for the cancer in Ms. Chanthavong's family that she did not inherit and therefore was not identified in her.  Based on Ms. Manning's family history, we recommended maternal and paternal relatives have genetic counseling and  testing. Ms. Kittleson will let us know if we can be of any assistance in coordinating genetic counseling and/or testing for these family members. ? ?FOLLOW-UP: Lastly, we discussed with Ms. Suliman that cancer genetics is a rapidly advancing field and it is possible that new genetic tests will be appropriate for her and/or her family members in the future. We encouraged her to remain in contact with cancer genetics on an annual basis so we can update her personal and family histories and let her know of advances in cancer genetics that  may benefit this family.  ? ?Our contact number was provided. Ms. Luther questions were answered to her satisfaction, and she knows she is welcome to call us at anytime with additional questions or co

## 2021-07-06 ENCOUNTER — Ambulatory Visit: Payer: Self-pay | Admitting: Family Medicine

## 2022-03-14 IMAGING — DX DG CHEST 1V PORT
1 series · 1 of 1 positions shown · non-contrast
Comparison: 01/15/2019

CLINICAL DATA: Left-sided chest pain

EXAM:
PORTABLE CHEST 1 VIEW

[chest]
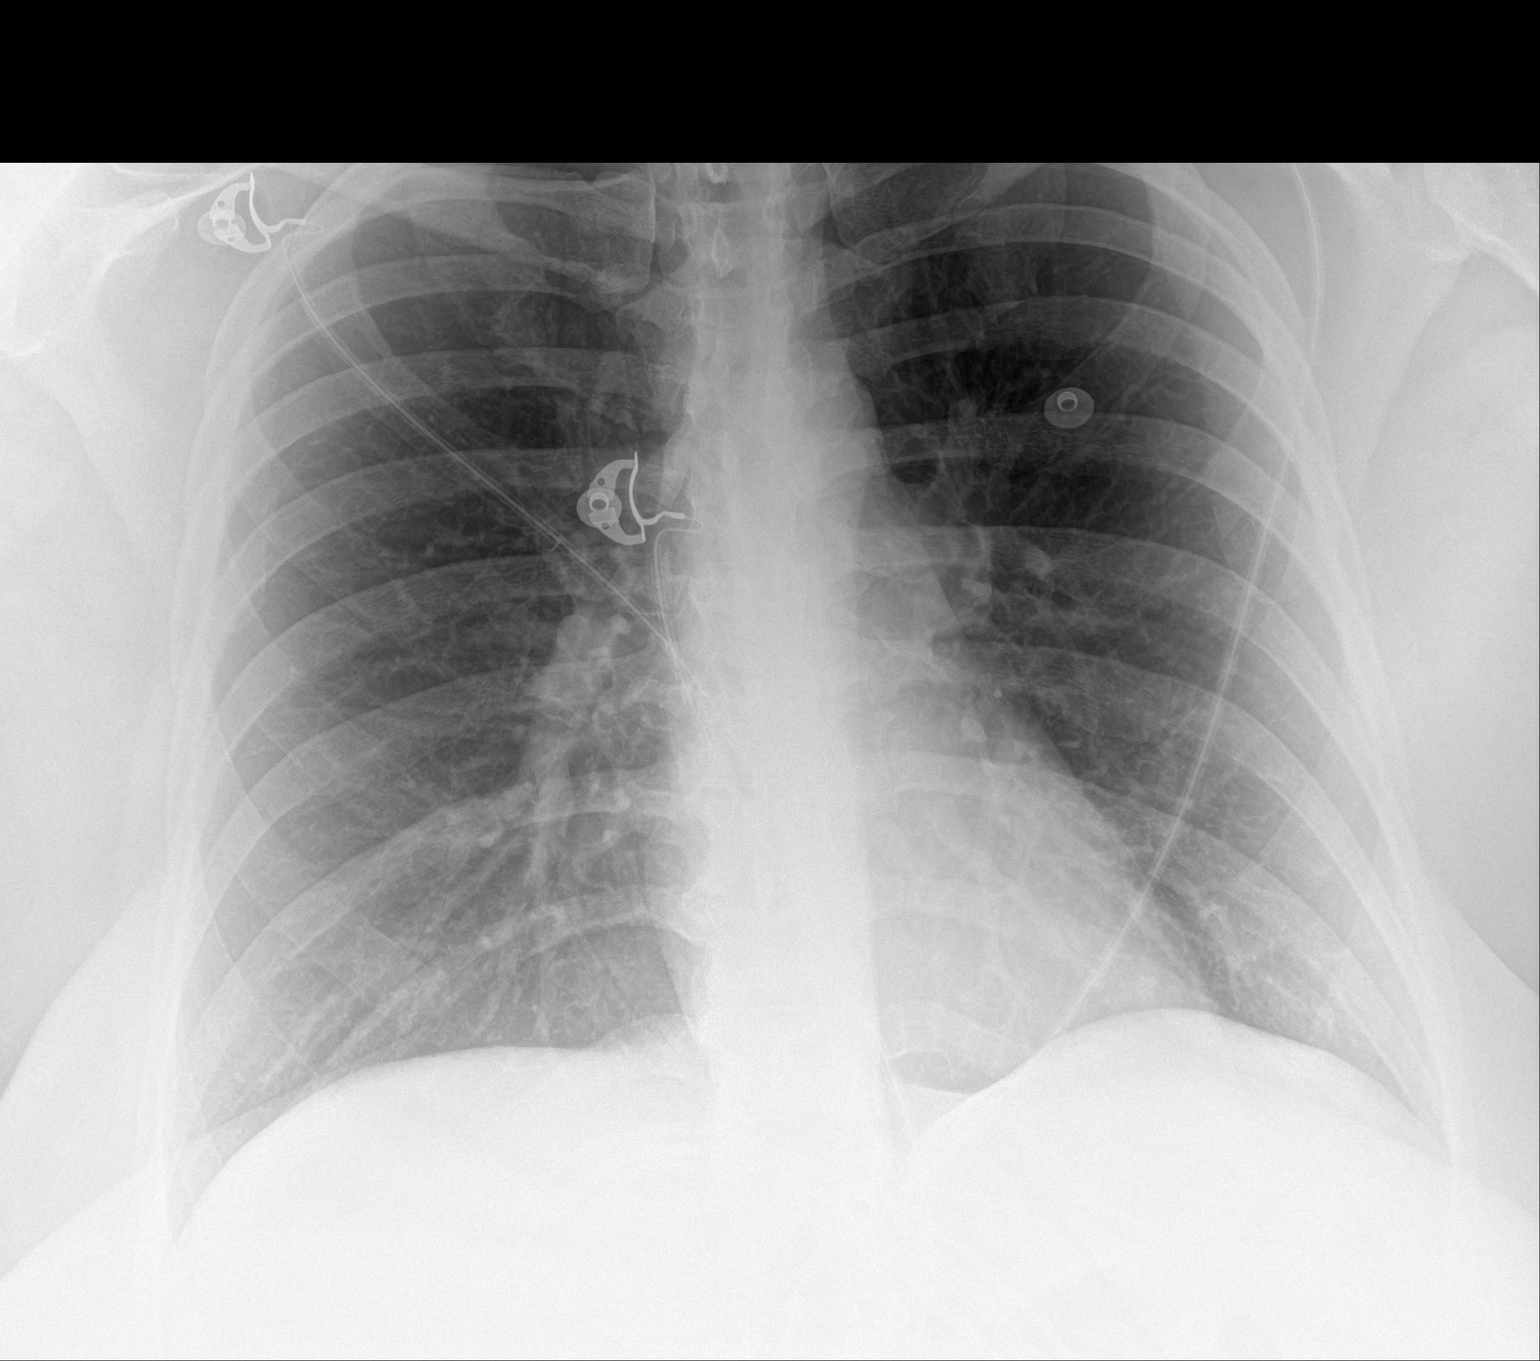

[1 of 1 positions shown; findings below may reference images not displayed]

FINDINGS: The heart size and mediastinal contours are within normal limits.
Both lungs are clear. The visualized skeletal structures are
unremarkable.
IMPRESSION: No acute abnormality of the lungs in AP portable projection.

## 2022-03-27 IMAGING — CT CT HEART MORP W/ CTA COR W/ SCORE W/ CA W/CM &/OR W/O CM
1 of 15 series · 3 of 20 positions shown, 4 images · non-contrast
Comparison: Chest radiograph 09/12/2020.

Addendum:
CLINICAL DATA: Chest pain

EXAM:
Cardiac/Coronary  CTA
TECHNIQUE: The patient was scanned on a Siemens Somatom go.Top scanner.

[Series 27: multiphase % cta coronary 0.60 · axial · 0.36mm/px · z∈[-1114,-1047]mm · 3 of 3972 slices shown, 4 images]
[im [id number]  vessel]
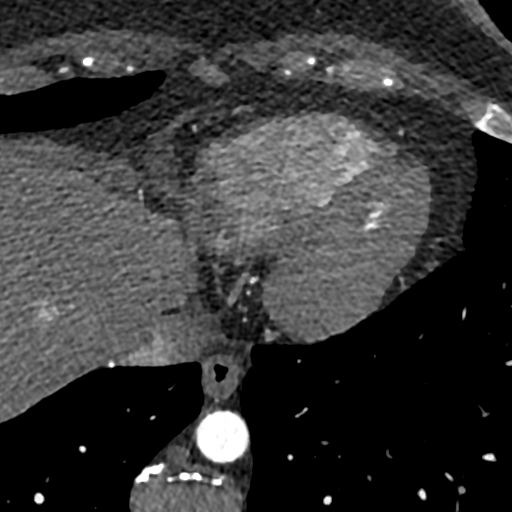
[im [id number]  lung]
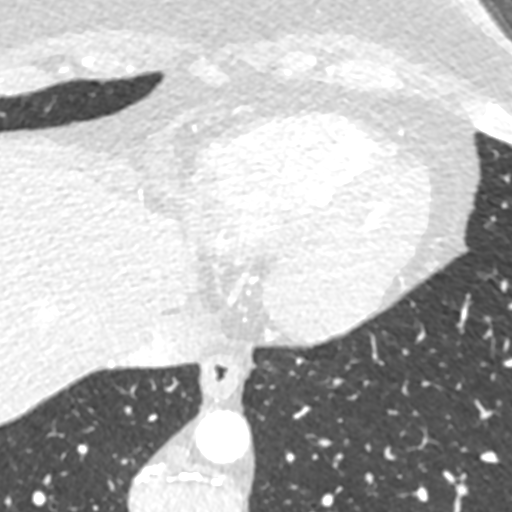
[im [id number]  vessel]
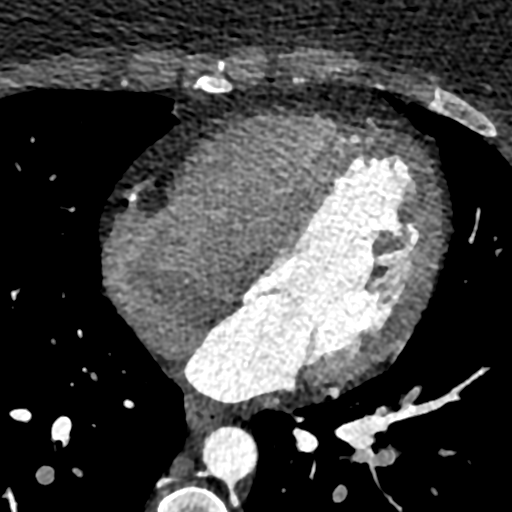
[im [id number]  vessel]
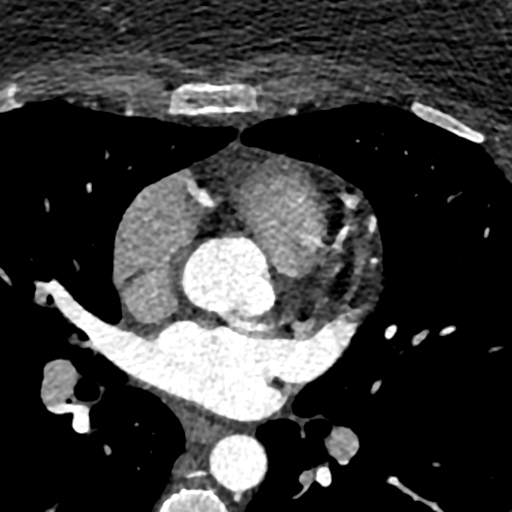

[3 of 20 positions shown; findings below may reference images not displayed]



Aortic Valve:  Trileaflet.  No calcifications.

Coronary Arteries:  Normal coronary origin.  Right dominance.

RCA is a dominant artery that gives rise to PDA and PLA. There is no
plaque.

Left main is a large artery that gives rise to LAD and LCX arteries.
LM has no disease

LAD has no plaque.

LCX is a non-dominant artery that gives rise to three obtuse
marginal branches. There is no plaque.

Other findings:

Normal pulmonary vein drainage into the left atrium.

Normal left atrial appendage without a thrombus.

Normal size of the pulmonary artery.
IMPRESSION: 1. Coronary calcium score of 0. Patient is low risk for coronary
events.

2. Normal coronary origin with right dominance.

3. No evidence of CAD.

4. CAD-RADS 0. Consider non-atherosclerotic causes of chest pain.

EXAM:
OVER-READ INTERPRETATION  CT CHEST

The following report is an over-read performed by radiologist Dr.
Fallon Jim [[email or web address]] on 09/26/2020. This over-read
does not include interpretation of cardiac or coronary anatomy or
pathology. The coronary CTA interpretation by the cardiologist is
attached.
FINDINGS: Vascular: Normal aortic caliber. No central pulmonary embolism, on
this non-dedicated study.

Mediastinum/Nodes: No imaged thoracic adenopathy.

Lungs/Pleura: No pleural fluid.  Clear imaged lungs.

Upper Abdomen: 6 mm hepatic dome arterially hyperenhancing focus on
51/19. Normal imaged portions of the spleen, stomach.

Musculoskeletal: No acute osseous abnormality.
IMPRESSION: 1.  No acute findings in the imaged extracardiac chest.
2. Hepatic dome 6 mm hyperenhancing focus is favored to represent a
hemangioma or perfusion anomaly.



Aortic Valve:  Trileaflet.  No calcifications.

Coronary Arteries:  Normal coronary origin.  Right dominance.

RCA is a dominant artery that gives rise to PDA and PLA. There is no
plaque.

Left main is a large artery that gives rise to LAD and LCX arteries.
LM has no disease

LAD has no plaque.

LCX is a non-dominant artery that gives rise to three obtuse
marginal branches. There is no plaque.

Other findings:

Normal pulmonary vein drainage into the left atrium.

Normal left atrial appendage without a thrombus.

Normal size of the pulmonary artery.
IMPRESSION: 1. Coronary calcium score of 0. Patient is low risk for coronary
events.

2. Normal coronary origin with right dominance.

3. No evidence of CAD.

4. CAD-RADS 0. Consider non-atherosclerotic causes of chest pain.

## 2022-05-20 ENCOUNTER — Other Ambulatory Visit: Payer: Self-pay | Admitting: Physician Assistant

## 2022-05-20 ENCOUNTER — Ambulatory Visit
Admission: RE | Admit: 2022-05-20 | Discharge: 2022-05-20 | Disposition: A | Discharge status: Home / Self Care | Payer: PRIVATE HEALTH INSURANCE | Source: Ambulatory Visit | Attending: Physician Assistant | Admitting: Physician Assistant

## 2022-05-20 ENCOUNTER — Ambulatory Visit: Payer: 59 | Admitting: Nurse Practitioner

## 2022-05-20 ENCOUNTER — Encounter: Payer: Self-pay | Admitting: Nurse Practitioner

## 2022-05-20 ENCOUNTER — Other Ambulatory Visit: Payer: Self-pay

## 2022-05-20 DIAGNOSIS — S99921A Unspecified injury of right foot, initial encounter: Secondary | ICD-10-CM

## 2022-05-20 DIAGNOSIS — S99911A Unspecified injury of right ankle, initial encounter: Secondary | ICD-10-CM

## 2022-05-20 MED ORDER — TRAMADOL HCL 50 MG PO TABS
50.0000 mg | ORAL_TABLET | Freq: Every evening | ORAL | 0 refills | Status: DC
  Filled 2022-05-20: qty 12, 5d supply, fill #0

## 2022-06-09 ENCOUNTER — Telehealth: Payer: Self-pay | Admitting: Nurse Practitioner

## 2022-06-09 ENCOUNTER — Other Ambulatory Visit (HOSPITAL_COMMUNITY)
Admission: RE | Admit: 2022-06-09 | Discharge: 2022-06-09 | Disposition: A | Discharge status: Home / Self Care | Payer: 59 | Source: Ambulatory Visit | Attending: Nurse Practitioner | Admitting: Nurse Practitioner

## 2022-06-09 ENCOUNTER — Ambulatory Visit (INDEPENDENT_AMBULATORY_CARE_PROVIDER_SITE_OTHER): Payer: 59 | Admitting: Nurse Practitioner

## 2022-06-09 ENCOUNTER — Encounter: Payer: Self-pay | Admitting: Nurse Practitioner

## 2022-06-09 VITALS — BP 142/84 | HR 85 | Temp 98.8°F | Ht 68.0 in | Wt 289.0 lb

## 2022-06-09 DIAGNOSIS — F419 Anxiety disorder, unspecified: Secondary | ICD-10-CM | POA: Insufficient documentation

## 2022-06-09 DIAGNOSIS — G4700 Insomnia, unspecified: Secondary | ICD-10-CM | POA: Insufficient documentation

## 2022-06-09 DIAGNOSIS — N760 Acute vaginitis: Secondary | ICD-10-CM | POA: Insufficient documentation

## 2022-06-09 DIAGNOSIS — E063 Autoimmune thyroiditis: Secondary | ICD-10-CM

## 2022-06-09 DIAGNOSIS — R1012 Left upper quadrant pain: Secondary | ICD-10-CM | POA: Diagnosis not present

## 2022-06-09 DIAGNOSIS — R3581 Nocturnal polyuria: Secondary | ICD-10-CM | POA: Diagnosis not present

## 2022-06-09 DIAGNOSIS — I10 Essential (primary) hypertension: Secondary | ICD-10-CM | POA: Diagnosis not present

## 2022-06-09 DIAGNOSIS — R0683 Snoring: Secondary | ICD-10-CM

## 2022-06-09 DIAGNOSIS — F32A Depression, unspecified: Secondary | ICD-10-CM | POA: Insufficient documentation

## 2022-06-09 DIAGNOSIS — Z124 Encounter for screening for malignant neoplasm of cervix: Secondary | ICD-10-CM

## 2022-06-09 DIAGNOSIS — Z1231 Encounter for screening mammogram for malignant neoplasm of breast: Secondary | ICD-10-CM

## 2022-06-09 DIAGNOSIS — Z13220 Encounter for screening for lipoid disorders: Secondary | ICD-10-CM | POA: Diagnosis not present

## 2022-06-09 DIAGNOSIS — Z6841 Body Mass Index (BMI) 40.0 and over, adult: Secondary | ICD-10-CM

## 2022-06-09 LAB — POC URINALSYSI DIPSTICK (AUTOMATED)
Bilirubin, UA: NEGATIVE
Glucose, UA: NEGATIVE
Ketones, UA: NEGATIVE
Nitrite, UA: NEGATIVE
Protein, UA: NEGATIVE
Spec Grav, UA: 1.01 (ref 1.010–1.025)
Urobilinogen, UA: 0.2 E.U./dL
pH, UA: 6 (ref 5.0–8.0)

## 2022-06-09 LAB — POCT URINE PREGNANCY: Preg Test, Ur: NEGATIVE

## 2022-06-09 MED ORDER — TRAZODONE HCL 50 MG PO TABS: 50.0000 mg | ORAL_TABLET | Freq: Every day | ORAL | 1 refills | Status: DC

## 2022-06-09 NOTE — Progress Notes (Unsigned)
Tomasita Morrow, NP-C Phone: [phone]  Kelsey Reed is a 42 y.o. female who presents today to establish care. She reports worsening fatigue and would like lab work. She also reports left upper quadrant abdominal pain.   HYPERTENSION Disease Monitoring Home BP Monitoring *** Chest pain- ***    Dyspnea- *** Medications Compliance-  ***. Lightheadedness-  ***  Edema- *** BMET    Component Value Date/Time   NA 135 09/12/2020 1253   NA 137 11/16/2013 1347   K 3.7 09/12/2020 1253   K 4.0 11/16/2013 1347   CL 102 09/12/2020 1253   CL 105 11/16/2013 1347   CO2 25 09/12/2020 1253   CO2 22 11/16/2013 1347   GLUCOSE 111 (H) 09/12/2020 1253   GLUCOSE 95 11/16/2013 1347   BUN 12 09/12/2020 1253   BUN 13 11/16/2013 1347   CREATININE 0.73 09/12/2020 1253   CREATININE 0.65 11/16/2013 1347   CALCIUM 9.2 09/12/2020 1253   CALCIUM 9.5 11/16/2013 [place] >60 09/12/2020 1253   GFRNONAA >60 11/16/2013 1347   GFRAA >60 01/15/2019 1140   GFRAA >60 11/16/2013 1347   HYPOTHYROIDISM Disease Monitoring Weight changes: ***  Skin Changes: *** Palpitations: *** Heat/Cold intolerance: *** Medication Monitoring Compliance:  ***   Last TSH:   Lab Results  Component Value Date   TSH > 100.0 (H) 01/28/2012     Active Ambulatory Problems    Diagnosis Date Noted   Essential hypertension 09/16/2020   Atypical chest pain 09/16/2020   Family history of breast cancer 05/27/2021   Family history of ovarian cancer 05/27/2021   Family history of colon cancer 05/27/2021   Genetic testing 06/16/2021   Anxiety 06/09/2022   Depression 06/09/2022   Hashimoto's thyroiditis 06/09/2020   Vaginitis and vulvovaginitis 06/09/2022   Left upper quadrant abdominal pain 06/09/2022   Nocturnal polyuria 06/09/2022   Morbid obesity with BMI of 40.0-44.9, adult ([place]) 06/09/2022   Insomnia 06/09/2022   Resolved Ambulatory Problems    Diagnosis Date Noted   Hypertension 06/09/2022   Past Medical  History:  Diagnosis Date   Clotting disorder ([place]) 2019   Hashimoto's disease    Thyroid disease     Family History  Problem Relation Age of Onset   Hypertension Mother    Hypertension Father    Colon cancer Maternal Uncle 14   Breast cancer Paternal Aunt    Cancer Paternal Aunt    Breast cancer Paternal Aunt    Cancer Paternal Aunt    Breast cancer Paternal Aunt    Cancer Paternal Aunt    Ovarian cancer Maternal Grandmother 70   Cancer Maternal Grandmother    Hypertension Maternal Grandmother    Vision loss Maternal Grandmother    Hypertension Maternal Grandfather    Breast cancer Paternal Grandmother        dx under 14, metastatic recurrence d. 73   Cancer Paternal Grandmother    Diabetes Paternal Grandfather     Social History   Socioeconomic History   Marital status: Divorced    Spouse name: Not on file   Number of children: Not on file   Years of education: Not on file   Highest education level: Not on file  Occupational History   Not on file  Tobacco Use   Smoking status: Former    Packs/day: 0.25    Years: 15.00    Additional pack years: 0.00    Total pack years: 3.75    Types: Cigarettes    Quit date:  02/13/2018    Years since quitting: 4.3   Smokeless tobacco: Never  Vaping Use   Vaping Use: Never used  Substance and Sexual Activity   Alcohol use: Yes    Comment: I rarely drink, might be once a month ill have 2   Drug use: No   Sexual activity: Yes    Birth control/protection: Condom, I.U.D.  Other Topics Concern   Not on file  Social History Narrative   Not on file   Social Determinants of Health   Financial Resource Strain: Not on file  Food Insecurity: Not on file  Transportation Needs: Not on file  Physical Activity: Not on file  Stress: Not on file  Social Connections: Not on file  Intimate Partner Violence: Not on file    ROS See HPI  Objective  Physical Exam Vitals:   06/09/22 1345 06/09/22 1429  BP: (!) 144/90 (!)  142/84  Pulse: 85   Temp: 98.8 F (37.1 C)   SpO2: 96%     BP Readings from Last 3 Encounters:  06/09/22 (!) 142/84  09/25/20 (!) 130/95  09/16/20 (!) 150/92   Wt Readings from Last 3 Encounters:  06/09/22 289 lb (131.1 kg)  09/16/20 241 lb (109.3 kg)  05/29/20 247 lb (112 kg)    Physical Exam Exam conducted with a chaperone present Gracy Racer, CMA).  Constitutional:      General: She is not in acute distress.    Appearance: Normal appearance.  HENT:     Head: Normocephalic.  Cardiovascular:     Rate and Rhythm: Normal rate and regular rhythm.     Heart sounds: Normal heart sounds.  Pulmonary:     Effort: Pulmonary effort is normal.     Breath sounds: Normal breath sounds.  Abdominal:     General: Abdomen is flat. Bowel sounds are normal. There is no distension.     Palpations: Abdomen is soft. There is no mass.     Tenderness: There is no abdominal tenderness.  Genitourinary:    Pubic Area: No rash.      Labia:        Right: No rash or lesion.        Left: No rash or lesion.      Vagina: Vaginal discharge present. No bleeding or lesions.     Cervix: Normal.     Comments: IUD strings present.  Skin:    General: Skin is warm and dry.  Neurological:     General: No focal deficit present.     Mental Status: She is alert.  Psychiatric:        Mood and Affect: Mood normal.        Behavior: Behavior normal.    Assessment/Plan:   Vaginitis and vulvovaginitis -     POCT Urinalysis Dipstick (Automated) -     Cervicovaginal ancillary only -     Ambulatory referral to Obstetrics / Gynecology  Left upper quadrant abdominal pain -     POCT Pregnancy, Urine -     CT ABDOMEN PELVIS W CONTRAST; Future  Nocturnal polyuria -     POCT Urinalysis Dipstick (Automated) -     POCT Pregnancy, Urine  Essential hypertension -     CBC with Differential/Platelet -     Comprehensive metabolic panel -     Ambulatory referral to Pulmonology  Hashimoto's thyroiditis -      TSH -     T4, free -     T3, free  Depression, unspecified depression type -     VITAMIN D 25 Hydroxy (Vit-D Deficiency, Fractures) -     traZODone HCl; Take 1 tablet (50 mg total) by mouth at bedtime.  Dispense: 90 tablet; Refill: 1  Insomnia, unspecified type -     traZODone HCl; Take 1 tablet (50 mg total) by mouth at bedtime.  Dispense: 90 tablet; Refill: 1  Morbid obesity with BMI of 40.0-44.9, adult ([facility]) -     Hemoglobin A1c -     Ambulatory referral to Pulmonology  Snoring -     Ambulatory referral to Pulmonology  Lipid screening -     Lipid panel  Screening mammogram for breast cancer -     3D Screening Mammogram, Left and Right; Future  Screening for cervical cancer -     Ambulatory referral to Obstetrics / Gynecology    Return in about 2 weeks (around 06/23/2022) for Blood pressure check (nurse visit) then 3 month follow up.   Tomasita Morrow, NP-C [place]

## 2022-06-09 NOTE — Telephone Encounter (Signed)
Vm full. thanks

## 2022-06-10 ENCOUNTER — Encounter: Payer: Self-pay | Admitting: Nurse Practitioner

## 2022-06-10 ENCOUNTER — Telehealth: Payer: Self-pay | Admitting: Nurse Practitioner

## 2022-06-10 LAB — CBC WITH DIFFERENTIAL/PLATELET
Basophils Absolute: 0.1 10*3/uL (ref 0.0–0.1)
Basophils Relative: 1.1 % (ref 0.0–3.0)
Eosinophils Absolute: 0.3 10*3/uL (ref 0.0–0.7)
Eosinophils Relative: 2.5 % (ref 0.0–5.0)
HCT: 42.6 % (ref 36.0–46.0)
Hemoglobin: 14.3 g/dL (ref 12.0–15.0)
Lymphocytes Relative: 31.3 % (ref 12.0–46.0)
Lymphs Abs: 3.8 10*3/uL (ref 0.7–4.0)
MCHC: 33.7 g/dL (ref 30.0–36.0)
MCV: 90.4 fl (ref 78.0–100.0)
Monocytes Absolute: 0.6 10*3/uL (ref 0.1–1.0)
Monocytes Relative: 4.9 % (ref 3.0–12.0)
Neutro Abs: 7.3 10*3/uL (ref 1.4–7.7)
Neutrophils Relative %: 60.2 % (ref 43.0–77.0)
Platelets: 304 10*3/uL (ref 150.0–400.0)
RBC: 4.71 Mil/uL (ref 3.87–5.11)
RDW: 13.5 % (ref 11.5–15.5)
WBC: 12.1 10*3/uL — ABNORMAL HIGH (ref 4.0–10.5)

## 2022-06-10 LAB — COMPREHENSIVE METABOLIC PANEL
ALT: 18 U/L (ref 0–35)
AST: 16 U/L (ref 0–37)
Albumin: 4.6 g/dL (ref 3.5–5.2)
Alkaline Phosphatase: 98 U/L (ref 39–117)
BUN: 10 mg/dL (ref 6–23)
CO2: 23 mEq/L (ref 19–32)
Calcium: 9.4 mg/dL (ref 8.4–10.5)
Chloride: 102 mEq/L (ref 96–112)
Creatinine, Ser: 0.67 mg/dL (ref 0.40–1.20)
GFR: 109.52 mL/min (ref >60.00)
Glucose, Bld: 93 mg/dL (ref 70–99)
Potassium: 4.1 mEq/L (ref 3.5–5.1)
Sodium: 135 mEq/L (ref 135–145)
Total Bilirubin: 0.4 mg/dL (ref 0.2–1.2)
Total Protein: 7.6 g/dL (ref 6.0–8.3)

## 2022-06-10 LAB — HEMOGLOBIN A1C: Hgb A1c MFr Bld: 6 % (ref 4.6–6.5)

## 2022-06-10 LAB — LIPID PANEL
Cholesterol: 201 mg/dL — ABNORMAL HIGH (ref 0–200)
HDL: 41 mg/dL (ref >39.00)
LDL Cholesterol: 128 mg/dL — ABNORMAL HIGH (ref 0–99)
NonHDL: 159.53
Total CHOL/HDL Ratio: 5
Triglycerides: 159 mg/dL — ABNORMAL HIGH (ref 0.0–149.0)
VLDL: 31.8 mg/dL (ref 0.0–40.0)

## 2022-06-10 LAB — T3, FREE: T3, Free: 3.1 pg/mL (ref 2.3–4.2)

## 2022-06-10 LAB — VITAMIN D 25 HYDROXY (VIT D DEFICIENCY, FRACTURES): VITD: 14.84 ng/mL — ABNORMAL LOW (ref 30.00–100.00)

## 2022-06-10 LAB — TSH: TSH: 10.72 u[IU]/mL — ABNORMAL HIGH (ref 0.35–5.50)

## 2022-06-10 LAB — T4, FREE: Free T4: 1.25 ng/dL (ref 0.60–1.60)

## 2022-06-10 NOTE — Patient Instructions (Signed)
YOUR MAMMOGRAM IS DUE, PLEASE CALL AND GET THIS SCHEDULED! [facility] - call [phone]    

## 2022-06-10 NOTE — Assessment & Plan Note (Signed)
Chronic. On Lexpro 20 mg daily. PHQ-19 and GAD- 18 today. Denies SI/HI. Does not want to change or increase medication. Will add Trazodone since patient is also having difficulty sleeping. Will monitor.

## 2022-06-10 NOTE — Assessment & Plan Note (Signed)
Chronic. On Levothyroxine 175 mcg. Symptomatic. Will check TSH today and make adjustments if needed. Will continue to monitor.

## 2022-06-10 NOTE — Assessment & Plan Note (Signed)
Will check A1c today. Encouraged healthy diet and exercise.  

## 2022-06-10 NOTE — Assessment & Plan Note (Signed)
Urine culture pending. Pregnancy test negative. Will proceed with imaging for further evaluation.

## 2022-06-10 NOTE — Assessment & Plan Note (Signed)
Chronic. Has not taken her Atenolol for the last several months. Advised patient to start taking her medication daily as prescribed. She will continue checking her blood pressure and keep a log to bring at her next appointment. She will return in 2 weeks for a blood pressure check. Lab work as outlined. Will continue to monitor.

## 2022-06-10 NOTE — Telephone Encounter (Signed)
Vm full . thanks

## 2022-06-10 NOTE — Assessment & Plan Note (Signed)
Will trial patient on Trazodone 50 mg at bedtime. Will also refer for sleep study.

## 2022-06-10 NOTE — Assessment & Plan Note (Signed)
UA in office with blood and leukocytes. Will send for culture. Pelvic exam complete- discharge noted. Vaginal swab obtained. Will contact patient with results.

## 2022-06-11 ENCOUNTER — Telehealth: Payer: Self-pay

## 2022-06-11 ENCOUNTER — Other Ambulatory Visit: Payer: Self-pay | Admitting: Nurse Practitioner

## 2022-06-11 DIAGNOSIS — B9689 Other specified bacterial agents as the cause of diseases classified elsewhere: Secondary | ICD-10-CM

## 2022-06-11 DIAGNOSIS — A5901 Trichomonal vulvovaginitis: Secondary | ICD-10-CM

## 2022-06-11 DIAGNOSIS — E063 Autoimmune thyroiditis: Secondary | ICD-10-CM

## 2022-06-11 LAB — CERVICOVAGINAL ANCILLARY ONLY
Bacterial Vaginitis (gardnerella): POSITIVE — AB
Candida Glabrata: NEGATIVE
Candida Vaginitis: NEGATIVE
Chlamydia: NEGATIVE
Comment: NEGATIVE
Comment: NEGATIVE
Comment: NEGATIVE
Comment: NEGATIVE
Comment: NEGATIVE
Comment: NORMAL
Neisseria Gonorrhea: NEGATIVE
Trichomonas: POSITIVE — AB

## 2022-06-11 LAB — URINE CULTURE
MICRO NUMBER:: 14717952
SPECIMEN QUALITY:: ADEQUATE

## 2022-06-11 MED ORDER — LEVOTHYROXINE SODIUM 200 MCG PO TABS: 200.0000 ug | ORAL_TABLET | Freq: Every day | ORAL | 1 refills | Status: DC

## 2022-06-11 MED ORDER — METRONIDAZOLE 500 MG PO TABS: 500.0000 mg | ORAL_TABLET | Freq: Two times a day (BID) | ORAL | 0 refills | Status: DC

## 2022-06-11 NOTE — Telephone Encounter (Signed)
Tried to call pt and inform her about the labs; but pts mailbox is full and I was unable to leave a VM

## 2022-06-21 ENCOUNTER — Encounter: Payer: Self-pay | Admitting: Obstetrics and Gynecology

## 2022-06-24 ENCOUNTER — Ambulatory Visit: Payer: 59

## 2022-06-25 ENCOUNTER — Ambulatory Visit
Admission: RE | Admit: 2022-06-25 | Discharge: 2022-06-25 | Disposition: A | Discharge status: Home / Self Care | Payer: 59 | Source: Ambulatory Visit | Attending: Nurse Practitioner | Admitting: Nurse Practitioner

## 2022-06-25 DIAGNOSIS — Z1231 Encounter for screening mammogram for malignant neoplasm of breast: Secondary | ICD-10-CM | POA: Insufficient documentation

## 2022-06-28 ENCOUNTER — Other Ambulatory Visit: Payer: Self-pay | Admitting: Nurse Practitioner

## 2022-06-28 ENCOUNTER — Ambulatory Visit
Admission: RE | Admit: 2022-06-28 | Discharge: 2022-06-28 | Disposition: A | Discharge status: Home / Self Care | Payer: 59 | Source: Ambulatory Visit | Attending: Nurse Practitioner | Admitting: Nurse Practitioner

## 2022-06-28 DIAGNOSIS — R1012 Left upper quadrant pain: Secondary | ICD-10-CM | POA: Diagnosis not present

## 2022-06-28 DIAGNOSIS — R109 Unspecified abdominal pain: Secondary | ICD-10-CM | POA: Diagnosis not present

## 2022-06-28 DIAGNOSIS — F419 Anxiety disorder, unspecified: Secondary | ICD-10-CM

## 2022-06-28 DIAGNOSIS — F32A Depression, unspecified: Secondary | ICD-10-CM

## 2022-06-28 MED ORDER — IOHEXOL 300 MG/ML  SOLN
100.0000 mL | Freq: Once | INTRAMUSCULAR | Status: AC | PRN
  Administered 2022-06-28: 100 mL via INTRAVENOUS

## 2022-06-29 ENCOUNTER — Encounter: Payer: Self-pay | Admitting: Nurse Practitioner

## 2022-06-29 MED ORDER — ATENOLOL 25 MG PO TABS: 25.0000 mg | ORAL_TABLET | Freq: Every day | ORAL | 3 refills | Status: DC

## 2022-06-29 MED ORDER — ESCITALOPRAM OXALATE 20 MG PO TABS: 20.0000 mg | ORAL_TABLET | Freq: Every day | ORAL | 3 refills | Status: DC

## 2022-07-02 ENCOUNTER — Ambulatory Visit (INDEPENDENT_AMBULATORY_CARE_PROVIDER_SITE_OTHER): Payer: 59 | Admitting: Adult Health

## 2022-07-02 ENCOUNTER — Encounter: Payer: Self-pay | Admitting: Adult Health

## 2022-07-02 VITALS — BP 134/80 | HR 92 | Temp 97.9°F | Ht 68.0 in | Wt 291.8 lb

## 2022-07-02 DIAGNOSIS — R0683 Snoring: Secondary | ICD-10-CM | POA: Insufficient documentation

## 2022-07-02 NOTE — Progress Notes (Signed)
Reviewed and agree with assessment/plan.   Cynde Menard, MD [facility] 07/02/2022, 4:39 PM Pager:  [phone]  

## 2022-07-02 NOTE — Assessment & Plan Note (Signed)
Snoring, restless sleep, insomnia, gasping for air, daytime sleepiness all suspicious for underlying sleep apnea.  Will set patient up for home sleep study.  Patient education given  - discussed how weight can impact sleep and risk for sleep disordered breathing - discussed options to assist with weight loss: combination of diet modification, cardiovascular and strength training exercises   - had an extensive discussion regarding the adverse health consequences related to untreated sleep disordered breathing - specifically discussed the risks for hypertension, coronary artery disease, cardiac dysrhythmias, cerebrovascular disease, and diabetes - lifestyle modification discussed   - discussed how sleep disruption can increase risk of accidents, particularly when driving - safe driving practices were discussed   Plan  Patient Instructions  Set up for home sleep study Healthy sleep regimen Not drive if sleeping Follow-up in 6 weeks to discuss sleep study results and treatment plan

## 2022-07-02 NOTE — Progress Notes (Signed)
@Patient  ID: Kelsey Reed, female    DOB: [birth year], 42 y.o.   MRN: [id number]  Chief Complaint  Patient presents with   sleep consult    Referring provider: Bethanie Dicker, NP  HPI: 42 year old female seen for sleep consult July 02, 2022 for snoring, gasping for air, restless sleep and daytime sleepiness  TEST/EVENTS :   07/02/2022 Sleep consult  Patient presents for sleep consult today.  Kindly referred by primary care provider Gardiner Rhyme, NP.  Patient complains of snoring, restless sleep, gasping for air, daytime sleepiness.  Patient goes to bed around 9 PM.  Takes a few minutes to go to sleep.  Is up few or several times throughout the night.  Gets up at 7 AM.  Weight is up at 50 pounds.  Current weight is at 291 pounds with a BMI of 44.  Patient has never had a sleep study before.  Has no removable dental work.  No has no history of congestive heart failure or stroke.  Started on Trazodone, seems to be working very well. Can sleep longer.   No symptoms suspicious for cataplexy or sleep paralysis.  Epworth score is 4 out of 24.  Typically gets sleepy if she sits down to watch TV or in the evening hours. Is prone to chronic headaches. 2 cups of caffeine . No removeable dental work. Has no energy.   Social history patient is divorced.  She has children-3 (11-18) .  She works as a Clinical biochemist.  Social alcohol.  Quit smoking in 2019.  No drug use.    Family history none.  Surgical history none.  No Known Allergies  Immunization History  Administered Date(s) Administered   Hep A / Hep B 07/06/2012   Hepatitis B, ADULT 02/13/2018, 10/19/2018, 10/22/2019   Influenza-Unspecified 12/12/2013, 12/03/2014, 01/23/2018   PFIZER Comirnaty(Gray Top)Covid-19 Tri-Sucrose Vaccine 04/13/2019, 05/11/2019   PFIZER(Purple Top)SARS-COV-2 Vaccination 04/13/2019, 05/11/2019   PPD Test 01/15/2020, 01/21/2020   Tdap 05/21/2011, 07/06/2012    Past Medical History:  Diagnosis Date   Anxiety 2005    Clotting disorder 2019   Depression 2007   Family history of breast cancer    Family history of colon cancer    Family history of ovarian cancer    Hashimoto's disease    Hypertension    Thyroid disease     Tobacco History: Social History   Tobacco Use  Smoking Status Former   Packs/day: 0.25   Years: 15.00   Additional pack years: 0.00   Total pack years: 3.75   Types: Cigarettes   Quit date: 02/13/2018   Years since quitting: 4.3  Smokeless Tobacco Never   Counseling given: Not Answered   Outpatient Medications Prior to Visit  Medication Sig Dispense Refill   albuterol (VENTOLIN HFA) 108 (90 Base) MCG/ACT inhaler INHALE 2 PUFFS BY MOUTH EVERY 4 TO 6 HOURS AS DIRECTED AS NEEDED     atenolol (TENORMIN) 25 MG tablet Take 1 tablet (25 mg total) by mouth daily. 90 tablet 3   escitalopram (LEXAPRO) 20 MG tablet Take 1 tablet (20 mg total) by mouth daily. 90 tablet 3   levothyroxine (SYNTHROID) 200 MCG tablet Take 1 tablet (200 mcg total) by mouth daily. 90 tablet 1   traZODone (DESYREL) 50 MG tablet Take 1 tablet (50 mg total) by mouth at bedtime. 90 tablet 1   No facility-administered medications prior to visit.     Review of Systems:   Constitutional:   No  weight loss, night sweats,  Fevers, chills,  +fatigue, or  lassitude.  HEENT:   No headaches,  Difficulty swallowing,  Tooth/dental problems, or  Sore throat,                No sneezing, itching, ear ache, nasal congestion, post nasal drip,   CV:  No chest pain,  Orthopnea, PND, swelling in lower extremities, anasarca, dizziness, palpitations, syncope.   GI  No heartburn, indigestion, abdominal pain, nausea, vomiting, diarrhea, change in bowel habits, loss of appetite, bloody stools.   Resp: No shortness of breath with exertion or at rest.  No excess mucus, no productive cough,  No non-productive cough,  No coughing up of blood.  No change in color of mucus.  No wheezing.  No chest wall deformity  Skin: no rash  or lesions.  GU: no dysuria, change in color of urine, no urgency or frequency.  No flank pain, no hematuria   MS:  No joint pain or swelling.  No decreased range of motion.  No back pain.    Physical Exam  BP 134/80 (BP Location: Left Arm, Cuff Size: Large)   Pulse 92   Temp 97.9 F (36.6 C) (Temporal)   Ht  (1.727 m)   Wt 291 lb 12.8 oz (132.4 kg)   SpO2 96%   BMI 44.37 kg/m   GEN: A/Ox3; pleasant , NAD, well nourished    HEENT:  [place]/AT,   NOSE-clear, THROAT-clear, no lesions, no postnasal drip or exudate noted. Class 3 MP airway   NECK:  Supple w/ fair ROM; no JVD; normal carotid impulses w/o bruits; no thyromegaly or nodules palpated; no lymphadenopathy.    RESP  Clear  P & A; w/o, wheezes/ rales/ or rhonchi. no accessory muscle use, no dullness to percussion  CARD:  RRR, no m/r/g, no peripheral edema, pulses intact, no cyanosis or clubbing.  GI:   Soft & nt; nml bowel sounds; no organomegaly or masses detected.   Musco: Warm bil, no deformities or joint swelling noted.   Neuro: alert, no focal deficits noted.    Skin: Warm, no lesions or rashes    Lab Results:   BNP No results found for: "BNP"  ProBNP No results found for: "PROBNP"  Imaging:        No data to display          No results found for: "NITRICOXIDE"      Assessment & Plan:   Snoring Snoring, restless sleep, insomnia, gasping for air, daytime sleepiness all suspicious for underlying sleep apnea.  Will set patient up for home sleep study.  Patient education given  - discussed how weight can impact sleep and risk for sleep disordered breathing - discussed options to assist with weight loss: combination of diet modification, cardiovascular and strength training exercises   - had an extensive discussion regarding the adverse health consequences related to untreated sleep disordered breathing - specifically discussed the risks for hypertension, coronary artery disease, cardiac  dysrhythmias, cerebrovascular disease, and diabetes - lifestyle modification discussed   - discussed how sleep disruption can increase risk of accidents, particularly when driving - safe driving practices were discussed   Plan  Patient Instructions  Set up for home sleep study Healthy sleep regimen Not drive if sleeping Follow-up in 6 weeks to discuss sleep study results and treatment plan      Rubye Oaks, NP 07/02/2022

## 2022-07-02 NOTE — Patient Instructions (Signed)
Set up for home sleep study Healthy sleep regimen Not drive if sleeping Follow-up in 6 weeks to discuss sleep study results and treatment plan

## 2022-07-04 ENCOUNTER — Ambulatory Visit
Admission: EM | Admit: 2022-07-04 | Discharge: 2022-07-04 | Disposition: A | Discharge status: Home / Self Care | Payer: 59 | Attending: Urgent Care | Admitting: Urgent Care

## 2022-07-04 DIAGNOSIS — T7840XA Allergy, unspecified, initial encounter: Secondary | ICD-10-CM

## 2022-07-04 MED ORDER — METHYLPREDNISOLONE ACETATE 40 MG/ML IJ SUSP
40.0000 mg | Freq: Once | INTRAMUSCULAR | Status: AC
  Administered 2022-07-04: 40 mg via INTRAMUSCULAR

## 2022-07-04 MED ORDER — METHYLPREDNISOLONE 4 MG PO TBPK
ORAL_TABLET | ORAL | 0 refills | Status: DC
Start: 2022-07-04 — End: 2022-07-05

## 2022-07-04 NOTE — ED Triage Notes (Signed)
Patient presents to UC for allergic reaction. States he ate sushi yesterday. Has lip swelling and hand itching. Took benadryl.

## 2022-07-04 NOTE — Discharge Instructions (Signed)
If your symptoms recur, start the prescribed methylprednisolone taper.  Go to the emergency room if you develop difficulty breathing, difficulty speaking, difficulty swallowing.

## 2022-07-04 NOTE — ED Provider Notes (Signed)
Renaldo Fiddler    CSN: [id number] Arrival date & time: 07/04/22  4098      History   Chief Complaint Chief Complaint  Patient presents with   Allergic Reaction    HPI Kelsey Reed is a 42 y.o. female.    Allergic Reaction   Patient presents to urgent care for allergic reaction after eating "sushi" yesterday.  Endorses lip swelling and hand itching.  Took Benadryl.  Past Medical History:  Diagnosis Date   Anxiety 2005   Clotting disorder 2019   Depression 2007   Family history of breast cancer    Family history of colon cancer    Family history of ovarian cancer    Hashimoto's disease    Hypertension    Thyroid disease     Patient Active Problem List   Diagnosis Date Noted   Snoring 07/02/2022   Anxiety 06/09/2022   Depression 06/09/2022   Vaginitis and vulvovaginitis 06/09/2022   Left upper quadrant abdominal pain 06/09/2022   Nocturnal polyuria 06/09/2022   Morbid obesity with BMI of 40.0-44.9, adult 06/09/2022   Insomnia 06/09/2022   Genetic testing 06/16/2021   Family history of breast cancer 05/27/2021   Family history of ovarian cancer 05/27/2021   Family history of colon cancer 05/27/2021   Essential hypertension 09/16/2020   Atypical chest pain 09/16/2020   Hashimoto's thyroiditis 06/09/2020    No past surgical history on file.  OB History   No obstetric history on file.      Home Medications    Prior to Admission medications   Medication Sig Start Date End Date Taking? Authorizing Provider  albuterol (VENTOLIN HFA) 108 (90 Base) MCG/ACT inhaler INHALE 2 PUFFS BY MOUTH EVERY 4 TO 6 HOURS AS DIRECTED AS NEEDED 01/29/20   [provider]  atenolol (TENORMIN) 25 MG tablet Take 1 tablet (25 mg total) by mouth daily. 06/29/22   Bethanie Dicker, NP  escitalopram (LEXAPRO) 20 MG tablet Take 1 tablet (20 mg total) by mouth daily. 06/29/22   Bethanie Dicker, NP  levothyroxine (SYNTHROID) 200 MCG tablet Take 1 tablet (200 mcg total) by mouth  daily. 06/11/22   Bethanie Dicker, NP  traZODone (DESYREL) 50 MG tablet Take 1 tablet (50 mg total) by mouth at bedtime. 06/09/22   Bethanie Dicker, NP    Family History Family History  Problem Relation Age of Onset   Hypertension Mother    Hypertension Father    Colon cancer Maternal Uncle 17   Breast cancer Paternal Aunt    Cancer Paternal Aunt    Breast cancer Paternal Aunt    Cancer Paternal Aunt    Breast cancer Paternal Aunt    Cancer Paternal Aunt    Ovarian cancer Maternal Grandmother 53   Cancer Maternal Grandmother    Hypertension Maternal Grandmother    Vision loss Maternal Grandmother    Hypertension Maternal Grandfather    Breast cancer Paternal Grandmother        dx under 50, metastatic recurrence d. 96   Cancer Paternal Grandmother    Diabetes Paternal Grandfather     Social History Social History   Tobacco Use   Smoking status: Former    Packs/day: 0.25    Years: 15.00    Additional pack years: 0.00    Total pack years: 3.75    Types: Cigarettes    Quit date: 02/13/2018    Years since quitting: 4.3   Smokeless tobacco: Never  Vaping Use   Vaping Use: Never used  Substance Use Topics   Alcohol use: Yes    Comment: I rarely drink, might be once a month ill have 2   Drug use: No     Allergies   Patient has no known allergies.   Review of Systems Review of Systems   Physical Exam Triage Vital Signs ED Triage Vitals  Enc Vitals Group     BP      Pulse      Resp      Temp      Temp src      SpO2      Weight      Height      Head Circumference      Peak Flow      Pain Score      Pain Loc      Pain Edu?      Excl. in GC?    No data found.  Updated Vital Signs There were no vitals taken for this visit.  Visual Acuity Right Eye Distance:   Left Eye Distance:   Bilateral Distance:    Right Eye Near:   Left Eye Near:    Bilateral Near:     Physical Exam Vitals reviewed.  Constitutional:      Appearance: Normal appearance.   HENT:     Head:      Mouth/Throat:     Mouth: Mucous membranes are moist.     Comments: Edematous lips.  No oral edema. Cardiovascular:     Rate and Rhythm: Normal rate and regular rhythm.     Pulses: Normal pulses.     Heart sounds: Normal heart sounds.  Pulmonary:     Effort: Pulmonary effort is normal.     Breath sounds: Normal breath sounds.  Musculoskeletal:     Right hand: Swelling present.     Left hand: Swelling present.  Skin:    General: Skin is warm and dry.  Neurological:     General: No focal deficit present.     Mental Status: She is alert and oriented to person, place, and time.  Psychiatric:        Mood and Affect: Mood normal.        Behavior: Behavior normal.      UC Treatments / Results  Labs (all labs ordered are listed, but only abnormal results are displayed) Labs Reviewed - No data to display  EKG   Radiology No results found.  Procedures Procedures (including critical care time)  Medications Ordered in UC Medications - No data to display  Initial Impression / Assessment and Plan / UC Course  I have reviewed the triage vital signs and the nursing notes.  Pertinent labs & imaging results that were available during my care of the patient were reviewed by me and considered in my medical decision making (see chart for details).   Kelsey Reed is a 42 y.o. female presenting with allergic reaction to unknown allergen. Patient is afebrile without recent antipyretics, satting well on room air. Overall is well appearing and non-toxic, well hydrated, without respiratory distress. Pulmonary exam is unremarkable.  Lungs CTAB without wheezing, rhonchi, rales.  She has very edematous lips and hands.  Allergic reaction to unknown allergen.  Given Depo-Medrol 80 mg IM in clinic.  Discharging with a prescription for Medrol Dosepak to be used if symptoms persist or recur.  Counseled patient on potential for adverse effects with medications  prescribed/recommended today, ER and return-to-clinic precautions discussed, patient verbalized understanding and  agreement with care plan.   Final Clinical Impressions(s) / UC Diagnoses   Final diagnoses:  None   Discharge Instructions   None    ED Prescriptions   None    PDMP not reviewed this encounter.   Charma Igo, [place] 07/04/22 [phone]

## 2022-07-05 ENCOUNTER — Other Ambulatory Visit: Payer: Self-pay

## 2022-07-05 ENCOUNTER — Emergency Department
Admission: EM | Admit: 2022-07-05 | Discharge: 2022-07-05 | Disposition: A | Discharge status: Home / Self Care | Payer: 59 | Attending: Emergency Medicine | Admitting: Emergency Medicine

## 2022-07-05 DIAGNOSIS — T7840XA Allergy, unspecified, initial encounter: Secondary | ICD-10-CM | POA: Diagnosis present

## 2022-07-05 DIAGNOSIS — T783XXA Angioneurotic edema, initial encounter: Secondary | ICD-10-CM | POA: Diagnosis not present

## 2022-07-05 DIAGNOSIS — T782XXA Anaphylactic shock, unspecified, initial encounter: Secondary | ICD-10-CM | POA: Diagnosis not present

## 2022-07-05 MED ORDER — SODIUM CHLORIDE 0.9 % IV SOLN: Freq: Once | INTRAVENOUS | Status: AC

## 2022-07-05 MED ORDER — EPINEPHRINE 0.3 MG/0.3ML IJ SOAJ
INTRAMUSCULAR | Status: AC
  Administered 2022-07-05: 0.3 mg
  Filled 2022-07-05: qty 0.3

## 2022-07-05 MED ORDER — EPINEPHRINE 0.3 MG/0.3ML IJ SOAJ: 0.3000 mg | Freq: Once | INTRAMUSCULAR | 0 refills | Status: AC

## 2022-07-05 MED ORDER — PREDNISONE 10 MG PO TABS: ORAL_TABLET | ORAL | 0 refills | Status: DC

## 2022-07-05 MED ORDER — FAMOTIDINE IN NACL 20-0.9 MG/50ML-% IV SOLN
INTRAVENOUS | Status: AC
  Administered 2022-07-05: 20 mg via INTRAVENOUS
  Filled 2022-07-05: qty 50

## 2022-07-05 MED ORDER — DIPHENHYDRAMINE HCL 50 MG/ML IJ SOLN
INTRAMUSCULAR | Status: AC
  Administered 2022-07-05: 25 mg via INTRAVENOUS
  Filled 2022-07-05: qty 1

## 2022-07-05 MED ORDER — FAMOTIDINE IN NACL 20-0.9 MG/50ML-% IV SOLN: 20.0000 mg | Freq: Once | INTRAVENOUS | Status: AC

## 2022-07-05 MED ORDER — DIPHENHYDRAMINE HCL 50 MG/ML IJ SOLN: 25.0000 mg | Freq: Once | INTRAMUSCULAR | Status: AC

## 2022-07-05 MED ORDER — METHYLPREDNISOLONE SODIUM SUCC 125 MG IJ SOLR
INTRAMUSCULAR | Status: AC
  Administered 2022-07-05: 125 mg via INTRAVENOUS
  Filled 2022-07-05: qty 2

## 2022-07-05 MED ORDER — EPINEPHRINE 0.3 MG/0.3ML IJ SOAJ: 0.3000 mg | Freq: Once | INTRAMUSCULAR | 0 refills | Status: DC

## 2022-07-05 MED ORDER — METHYLPREDNISOLONE SODIUM SUCC 125 MG IJ SOLR: 125.0000 mg | Freq: Once | INTRAMUSCULAR | Status: AC

## 2022-07-05 NOTE — ED Provider Notes (Signed)
[facility] Provider Note    Event Date/Time   First MD Initiated Contact with Patient 07/05/22 [phone]     (approximate)   History   Allergic Reaction   HPI Kelsey Reed is a 42 y.o. female who presents for evaluation of probable allergic reaction.  The patient reports that more than 24 hours ago she developed some itching on her hands, swelling of her hands and fingers, and swelling of her lips after eating dinner.  She did not eat anything she has not had multiple times in the past.  The symptoms gradually got worse over the course of the last 24 hours.  She had been taking Benadryl and then cetirizine but it did not seem to help.  She was seen at an urgent care and had an intramuscular dose of steroids and got a prescription for a short course of prednisone, but by tonight the symptoms were worse enough that she felt like she had to constantly swallow and clear her throat and her lips were bigger.  She came to the ED for further evaluation.  She is not having any wheezing or difficulty breathing but she is concerned about the swelling in her face and the feeling of abnormality in her throat.  She has not had any nausea, vomiting, nor diarrhea.  No abdominal pain.  She cannot think of any exposure she has had that is different from usual.  She has not started any new medications and does not take any ACE inhibitor.     Physical Exam   Triage Vital Signs: ED Triage Vitals [07/05/22 0441]  Enc Vitals Group     BP (!) 165/78     Pulse Rate (!) 101     Resp (!) 21     Temp 97.9 F (36.6 C)     Temp Source Oral     SpO2 97 %     Weight      Height      Head Circumference      Peak Flow      Pain Score      Pain Loc      Pain Edu?      Excl. in GC?     Most recent vital signs: Vitals:   07/05/22 0630 07/05/22 0718  BP: 114/67 112/71  Pulse: 77 79  Resp: 17 16  Temp:  98 F (36.7 C)  SpO2: 94% 98%    General: Awake HEENT: Obvious angioedema  of her upper and lower lips.  The patient is reporting angioedema or puffiness to the rest of her face as well but it is not nearly as noticeable as the swelling to her lips.  She is able to open her mouth fully, has a fully patent airway with easy visualization to the tonsillar pillars, and no angioedema of the tongue.  She is speaking easily and clearly and tolerating her secretions without difficulty. CV:  Good peripheral perfusion.  Regular rate and rhythm. Resp:  Normal effort. Speaking easily and comfortably, no accessory muscle usage nor intercostal retractions.  No wheezing.  No stridor. Abd:  No distention.  Other:  Mood and affect are normal and appropriate.   ED Results / Procedures / Treatments   Labs (all labs ordered are listed, but only abnormal results are displayed) Labs Reviewed - No data to display   PROCEDURES:  Critical Care performed: No  .1-3 Lead EKG Interpretation  Performed by: Loleta Rose, MD Authorized by: Loleta Rose, MD  Interpretation: normal     ECG rate:  80   ECG rate assessment: normal     Rhythm: sinus rhythm     Ectopy: none     Conduction: normal   .Critical Care  Performed by: Loleta Rose, MD Authorized by: Loleta Rose, MD   Critical care provider statement:    Critical care time (minutes):  30   Critical care time was exclusive of:  Separately billable procedures and treating other patients   Critical care was necessary to treat or prevent imminent or life-threatening deterioration of the following conditions: anaphylaxis.   Critical care was time spent personally by me on the following activities:  Development of treatment plan with patient or surrogate, evaluation of patient's response to treatment, examination of patient, obtaining history from patient or surrogate, ordering and performing treatments and interventions, ordering and review of laboratory studies, ordering and review of radiographic studies, pulse oximetry,  re-evaluation of patient's condition and review of old charts     IMPRESSION / MDM / ASSESSMENT AND PLAN / ED COURSE  I reviewed the triage vital signs and the nursing notes.                              Differential diagnosis includes, but is not limited to, angioedema due to allergic reaction, medication side effect, other nonspecific allergic exposure, acute infection.  Patient's presentation is most consistent with acute presentation with potential threat to life or bodily function.   Interventions/Medications given:  Medications  EPINEPHrine (EPI-PEN) 0.3 mg/0.3 mL injection (0.3 mg  Given 07/05/22 0446)  famotidine (PEPCID) IVPB 20 mg premix (0 mg Intravenous Stopped 07/05/22 0554)  methylPREDNISolone sodium succinate (SOLU-MEDROL) 125 mg/2 mL injection 125 mg (125 mg Intravenous Given 07/05/22 0447)  diphenhydrAMINE (BENADRYL) injection 25 mg (25 mg Intravenous Given 07/05/22 0446)  0.9 %  sodium chloride infusion (0 mLs Intravenous Stopped 07/05/22 0710)    (Note:  hospital course my include additional interventions and/or labs/studies not listed above.)   Although the patient is having signs and symptoms of allergic reaction, I think it is unlikely she will develop airway compromise given that this has been a slow worsening over the last 24+ hours.  However given the extent of the angioedema and the fact that it seems to be worsening in spite of prior intramuscular steroids, I ordered full anaphylaxis treatment as documented in the medication section above, including intramuscular epinephrine.  Given the duration of symptoms and the fact that the epinephrine will wear off in a relatively short period of time, I do not feel that she needs an extended period of observation unless she feels strongly about it.  I will reassess after a couple of hours and see how she is feeling.  I dissipate discharge with epinephrine autoinjectors and a stronger and longer taper of prednisone with plans  to follow-up in ENT clinic.  Patient agrees with the plan.  The patient is on the cardiac monitor to evaluate for evidence of arrhythmia and/or significant heart rate changes.   Clinical Course as of 07/05/22 0726  Mon Jul 05, 2022  0706 I reassessed the patient and she is feeling better.  She looks better as well with decreased lip swelling compared to prior.  As previously described, I have a low suspicion she is going to decompensate and she is ready to go.  I gave her my usual and customary post anaphylaxis recommendations and gave her follow-up  information at the ENT/allergy clinic.  I gave strict return precautions and she understands and agrees with the plan. [CF]    Clinical Course User Index [CF] Loleta Rose, MD     FINAL CLINICAL IMPRESSION(S) / ED DIAGNOSES   Final diagnoses:  Anaphylaxis, initial encounter  Angioedema, initial encounter     Rx / DC Orders   ED Discharge Orders          Ordered    EPINEPHrine (EPIPEN 2-PAK) 0.3 mg/0.3 mL IJ SOAJ injection   Once,   Status:  Discontinued        07/05/22 0708    predniSONE (DELTASONE) 10 MG tablet  Status:  Discontinued        07/05/22 0708    EPINEPHrine (EPIPEN 2-PAK) 0.3 mg/0.3 mL IJ SOAJ injection   Once        07/05/22 0713    predniSONE (DELTASONE) 10 MG tablet        07/05/22 6144             Note:  This document was prepared using Dragon voice recognition software and may include unintentional dictation errors.   Loleta Rose, MD 07/05/22 [phone]

## 2022-07-05 NOTE — ED Notes (Signed)
Pt states she does not notice the lip swelling going down any, but it is not getting worse. Pt denies any difficulty breathing. Lights shut off, call light within reach, pt denies other needs at this time

## 2022-07-05 NOTE — ED Triage Notes (Signed)
Pt ate shrimp on Saturday night and noticed some itching of her hands, but noticed lip swelling the next morning that has progressively worsened. Pt now reports difficulty swallowing and swelling in throat. Pt taken straight to room and charge RN and provider made aware

## 2022-07-05 NOTE — ED Notes (Signed)
Discharge instructions reviewed with patient. Patient questions answered and opportunity for education reviewed. Patient voices understanding of discharge instructions with no further questions. Patient ambulatory with steady gait to lobby.  

## 2022-07-05 NOTE — ED Notes (Signed)
Pt ambulated to bathroom with steady gait, lip swelling sees to be going down some. Pt reports she feels like the swelling is going down as well .

## 2022-07-05 NOTE — Discharge Instructions (Addendum)
You have been seen in the Emergency Department (ED) today for an allergic reaction.  You have been stable throughout your stay in the Emergency Department.  Please take your medications as prescribed and follow up with your doctor as indicated.  You should also take a daily cetirizine (Zyrtec) which should help control your allergic symptoms.  Please keep your Epi-Pen with you at all times and use it if experience shortness of breath or difficulty breathing or if you believe you are having a severe allergic reaction.  If you use the Epi-Pen, though, please call 911 afterwards or go immediately to your nearest Emergency Department.  Return to the Emergency Department (ED) if you experience any worsening or new symptoms that concern you. 

## 2022-07-07 ENCOUNTER — Telehealth: Payer: Self-pay

## 2022-07-07 NOTE — Transitions of Care (Post Inpatient/ED Visit) (Signed)
   07/07/2022  Name: Kelsey Reed MRN: [id number] DOB: Aug 09, 1980  Today's TOC FU Call Status: Today's TOC FU Call Status:: Unsuccessul Call (1st Attempt) Unsuccessful Call (1st Attempt) Date: 07/07/22  Attempted to reach the patient regarding the most recent Inpatient/ED visit.  Follow Up Plan: Additional outreach attempts will be made to reach the patient to complete the Transitions of Care (Post Inpatient/ED visit) call.   Signature  Donnamarie Poag, CMA

## 2022-07-08 ENCOUNTER — Encounter: Payer: Self-pay | Admitting: Nurse Practitioner

## 2022-07-08 NOTE — Transitions of Care (Post Inpatient/ED Visit) (Signed)
   07/08/2022  Name: Kelsey Reed MRN: [id number] DOB: 1980-06-13  Today's TOC FU Call Status: Today's TOC FU Call Status:: Unsuccessful Call (2nd Attempt) Unsuccessful Call (1st Attempt) Date: 07/07/22 Unsuccessful Call (2nd Attempt) Date: 07/08/22  Attempted to reach the patient regarding the most recent Inpatient/ED visit.  Follow Up Plan: Additional outreach attempts will be made to reach the patient to complete the Transitions of Care (Post Inpatient/ED visit) call.   Signature  Donnamarie Poag, CMA

## 2022-07-08 NOTE — Telephone Encounter (Signed)
I attempted to call patient for [facility] call from ED visit. When reviewing  chart noticed my chart message regarding instructions to go to ED. I have called patient 4 times not able to reach. Also called mom on dpr and listed as emergency contact to have patient reach out to office. After calls were made seen that patient reviewed message on MyChart at 10:08. Called patient one more time letting know I seen that she had reviewed message but if she would contact our office if any questions. Will also send message to my chart making sure she plans on going to ED for evaluation.

## 2022-07-09 NOTE — Transitions of Care (Post Inpatient/ED Visit) (Signed)
Unable to reach pt by phone and left v/m requesting pt to call [facility] [phone].      07/09/2022  Name: Kelsey Reed MRN: [id number] DOB: Apr 13, 1980  Today's TOC FU Call Status: Today's TOC FU Call Status:: Unsuccessful Call (2nd Attempt) Unsuccessful Call (1st Attempt) Date: 07/07/22 Unsuccessful Call (2nd Attempt) Date: 07/09/22  Attempted to reach the patient regarding the most recent Inpatient/ED visit.  Follow Up Plan: Additional outreach attempts will be made to reach the patient to complete the Transitions of Care (Post Inpatient/ED visit) call.   Signature Lewanda Rife, LPN

## 2022-08-03 NOTE — Progress Notes (Deleted)
  Bethanie Dicker, NP-C Phone: [phone]  Kelsey Reed is a 42 y.o. female who presents today for ***  ***  Social History   Tobacco Use  Smoking Status Former   Packs/day: 0.25   Years: 15.00   Additional pack years: 0.00   Total pack years: 3.75   Types: Cigarettes   Quit date: 02/13/2018   Years since quitting: 4.4  Smokeless Tobacco Never    Current Outpatient Medications on File Prior to Visit  Medication Sig Dispense Refill   albuterol (VENTOLIN HFA) 108 (90 Base) MCG/ACT inhaler INHALE 2 PUFFS BY MOUTH EVERY 4 TO 6 HOURS AS DIRECTED AS NEEDED     atenolol (TENORMIN) 25 MG tablet Take 1 tablet (25 mg total) by mouth daily. 90 tablet 3   escitalopram (LEXAPRO) 20 MG tablet Take 1 tablet (20 mg total) by mouth daily. 90 tablet 3   levothyroxine (SYNTHROID) 200 MCG tablet Take 1 tablet (200 mcg total) by mouth daily. 90 tablet 1   predniSONE (DELTASONE) 10 MG tablet Take 6 tabs (60 mg) PO x 3 days, then take 4 tabs (40 mg) PO x 3 days, then take 2 tabs (20 mg) PO x 3 days, then take 1 tab (10 mg) PO x 3 days, then take 1/2 tab (5 mg) PO x 4 days. 41 tablet 0   traZODone (DESYREL) 50 MG tablet Take 1 tablet (50 mg total) by mouth at bedtime. 90 tablet 1   No current facility-administered medications on file prior to visit.     ROS see history of present illness  Objective  Physical Exam There were no vitals filed for this visit.  BP Readings from Last 3 Encounters:  07/05/22 112/71  07/04/22 (!) 150/106  07/02/22 134/80   Wt Readings from Last 3 Encounters:  07/02/22 291 lb 12.8 oz (132.4 kg)  06/09/22 289 lb (131.1 kg)  09/16/20 241 lb (109.3 kg)    Physical Exam   Assessment/Plan: Please see individual problem list.  There are no diagnoses linked to this encounter.   Health Maintenance: ***  No follow-ups on file.   Bethanie Dicker, NP-C  Primary Care - [employer]

## 2022-08-04 ENCOUNTER — Ambulatory Visit: Payer: 59 | Admitting: Nurse Practitioner

## 2022-08-05 ENCOUNTER — Encounter: Payer: Self-pay | Admitting: Nurse Practitioner

## 2022-08-09 DIAGNOSIS — T7840XA Allergy, unspecified, initial encounter: Secondary | ICD-10-CM | POA: Diagnosis not present

## 2022-08-18 ENCOUNTER — Telehealth: Payer: Self-pay

## 2022-08-18 NOTE — Telephone Encounter (Signed)
Lm for patient to ask if she has had HST as ordered at last OV.

## 2022-08-19 ENCOUNTER — Ambulatory Visit: Payer: 59 | Admitting: Adult Health

## 2022-08-19 NOTE — Telephone Encounter (Signed)
ATC patient--unable to leave vm due to mailbox being full.  Will close encounter per office protocol.  

## 2022-09-09 ENCOUNTER — Ambulatory Visit: Payer: 59 | Admitting: Nurse Practitioner

## 2022-09-22 ENCOUNTER — Ambulatory Visit: Payer: Self-pay | Admitting: Internal Medicine

## 2023-01-12 ENCOUNTER — Ambulatory Visit (INDEPENDENT_AMBULATORY_CARE_PROVIDER_SITE_OTHER): Payer: 59 | Admitting: Nurse Practitioner

## 2023-01-12 ENCOUNTER — Other Ambulatory Visit: Payer: Self-pay | Admitting: Nurse Practitioner

## 2023-01-12 ENCOUNTER — Other Ambulatory Visit (HOSPITAL_COMMUNITY)
Admission: RE | Admit: 2023-01-12 | Discharge: 2023-01-12 | Disposition: A | Discharge status: Home / Self Care | Payer: 59 | Source: Ambulatory Visit | Attending: Nurse Practitioner | Admitting: Nurse Practitioner

## 2023-01-12 ENCOUNTER — Encounter: Payer: Self-pay | Admitting: Nurse Practitioner

## 2023-01-12 VITALS — BP 126/80 | HR 77 | Temp 97.9°F | Ht 68.0 in | Wt 294.8 lb

## 2023-01-12 DIAGNOSIS — B9689 Other specified bacterial agents as the cause of diseases classified elsewhere: Secondary | ICD-10-CM | POA: Insufficient documentation

## 2023-01-12 DIAGNOSIS — R8290 Unspecified abnormal findings in urine: Secondary | ICD-10-CM

## 2023-01-12 DIAGNOSIS — A5901 Trichomonal vulvovaginitis: Secondary | ICD-10-CM | POA: Insufficient documentation

## 2023-01-12 DIAGNOSIS — Z114 Encounter for screening for human immunodeficiency virus [HIV]: Secondary | ICD-10-CM

## 2023-01-12 DIAGNOSIS — K219 Gastro-esophageal reflux disease without esophagitis: Secondary | ICD-10-CM

## 2023-01-12 DIAGNOSIS — Z1159 Encounter for screening for other viral diseases: Secondary | ICD-10-CM | POA: Diagnosis not present

## 2023-01-12 DIAGNOSIS — N760 Acute vaginitis: Secondary | ICD-10-CM | POA: Diagnosis not present

## 2023-01-12 DIAGNOSIS — Z0001 Encounter for general adult medical examination with abnormal findings: Secondary | ICD-10-CM

## 2023-01-12 DIAGNOSIS — E063 Autoimmune thyroiditis: Secondary | ICD-10-CM

## 2023-01-12 DIAGNOSIS — Z6841 Body Mass Index (BMI) 40.0 and over, adult: Secondary | ICD-10-CM | POA: Diagnosis not present

## 2023-01-12 DIAGNOSIS — R195 Other fecal abnormalities: Secondary | ICD-10-CM

## 2023-01-12 DIAGNOSIS — I10 Essential (primary) hypertension: Secondary | ICD-10-CM | POA: Diagnosis not present

## 2023-01-12 DIAGNOSIS — F419 Anxiety disorder, unspecified: Secondary | ICD-10-CM

## 2023-01-12 DIAGNOSIS — R109 Unspecified abdominal pain: Secondary | ICD-10-CM | POA: Diagnosis not present

## 2023-01-12 DIAGNOSIS — E782 Mixed hyperlipidemia: Secondary | ICD-10-CM

## 2023-01-12 DIAGNOSIS — Z23 Encounter for immunization: Secondary | ICD-10-CM

## 2023-01-12 DIAGNOSIS — F32A Depression, unspecified: Secondary | ICD-10-CM | POA: Diagnosis not present

## 2023-01-12 DIAGNOSIS — Z113 Encounter for screening for infections with a predominantly sexual mode of transmission: Secondary | ICD-10-CM | POA: Diagnosis not present

## 2023-01-12 DIAGNOSIS — R10A2 Flank pain, left side: Secondary | ICD-10-CM

## 2023-01-12 HISTORY — DX: Mixed hyperlipidemia: E78.2

## 2023-01-12 LAB — CBC WITH DIFFERENTIAL/PLATELET
Basophils Absolute: 0 10*3/uL (ref 0.0–0.1)
Basophils Relative: 0.5 % (ref 0.0–3.0)
Eosinophils Absolute: 0.3 10*3/uL (ref 0.0–0.7)
Eosinophils Relative: 3.2 % (ref 0.0–5.0)
HCT: 42.3 % (ref 36.0–46.0)
Hemoglobin: 13.7 g/dL (ref 12.0–15.0)
Lymphocytes Relative: 25.1 % (ref 12.0–46.0)
Lymphs Abs: 2.6 10*3/uL (ref 0.7–4.0)
MCHC: 32.4 g/dL (ref 30.0–36.0)
MCV: 91 fL (ref 78.0–100.0)
Monocytes Absolute: 0.5 10*3/uL (ref 0.1–1.0)
Monocytes Relative: 4.8 % (ref 3.0–12.0)
Neutro Abs: 6.9 10*3/uL (ref 1.4–7.7)
Neutrophils Relative %: 66.4 % (ref 43.0–77.0)
Platelets: 275 10*3/uL (ref 150.0–400.0)
RBC: 4.65 Mil/uL (ref 3.87–5.11)
RDW: 13.5 % (ref 11.5–15.5)
WBC: 10.3 10*3/uL (ref 4.0–10.5)

## 2023-01-12 LAB — LIPID PANEL
Cholesterol: 205 mg/dL — ABNORMAL HIGH (ref 0–200)
HDL: 34.7 mg/dL — ABNORMAL LOW (ref >39.00)
LDL Cholesterol: 145 mg/dL — ABNORMAL HIGH (ref 0–99)
NonHDL: 170.35
Total CHOL/HDL Ratio: 6
Triglycerides: 129 mg/dL (ref 0.0–149.0)
VLDL: 25.8 mg/dL (ref 0.0–40.0)

## 2023-01-12 LAB — URINALYSIS, ROUTINE W REFLEX MICROSCOPIC
Bilirubin Urine: NEGATIVE
Ketones, ur: NEGATIVE
Nitrite: NEGATIVE
Specific Gravity, Urine: 1.025 (ref 1.000–1.030)
Total Protein, Urine: NEGATIVE
Urine Glucose: NEGATIVE
Urobilinogen, UA: 0.2 (ref 0.0–1.0)
pH: 6 (ref 5.0–8.0)

## 2023-01-12 LAB — COMPREHENSIVE METABOLIC PANEL
ALT: 21 U/L (ref 0–35)
AST: 16 U/L (ref 0–37)
Albumin: 4.3 g/dL (ref 3.5–5.2)
Alkaline Phosphatase: 103 U/L (ref 39–117)
BUN: 12 mg/dL (ref 6–23)
CO2: 25 meq/L (ref 19–32)
Calcium: 8.9 mg/dL (ref 8.4–10.5)
Chloride: 103 meq/L (ref 96–112)
Creatinine, Ser: 0.65 mg/dL (ref 0.40–1.20)
GFR: 109.87 mL/min (ref >60.00)
Glucose, Bld: 127 mg/dL — ABNORMAL HIGH (ref 70–99)
Potassium: 4.1 meq/L (ref 3.5–5.1)
Sodium: 136 meq/L (ref 135–145)
Total Bilirubin: 0.5 mg/dL (ref 0.2–1.2)
Total Protein: 7.1 g/dL (ref 6.0–8.3)

## 2023-01-12 LAB — HEMOGLOBIN A1C: Hgb A1c MFr Bld: 6.1 % (ref 4.6–6.5)

## 2023-01-12 MED ORDER — BUSPIRONE HCL 7.5 MG PO TABS
7.5000 mg | ORAL_TABLET | Freq: Two times a day (BID) | ORAL | 1 refills | Status: DC
Start: 2023-01-12 — End: 2023-08-08

## 2023-01-12 MED ORDER — ALBUTEROL SULFATE HFA 108 (90 BASE) MCG/ACT IN AERS: 1.0000 | INHALATION_SPRAY | Freq: Four times a day (QID) | RESPIRATORY_TRACT | 5 refills | Status: AC | PRN

## 2023-01-12 MED ORDER — OMEPRAZOLE 40 MG PO CPDR
40.0000 mg | DELAYED_RELEASE_CAPSULE | Freq: Every day | ORAL | 3 refills | Status: AC
Start: 2023-01-12 — End: ?
  Filled 2023-12-01: qty 90, 90d supply, fill #0

## 2023-01-12 NOTE — Progress Notes (Signed)
Kelsey Dicker, NP-C Phone: [phone]  Kelsey Reed is a 42 y.o. female who presents today for annual exam.   She is requesting STD testing due to having a new partner. She also notes increased anxiety symptoms. She has felt overwhelmed recently due to life stressors. She is currently on Lexapro 20 mg daily. PHQ- 13 and GAD- 18 today. Denies SI/HI.   Diet: Cooks mostly at home, not much fried food Exercise: Walking Pap smear: Due- has upcoming appointment with Ob-Gyn in [place] Mammogram: 06/25/2022 Family history-  Colon cancer: No Breast cancer: Yes, paternal grandmother and 3 paternal aunts. Had negative genetic testing  Ovarian cancer: No Menses: Amenorrheic with IUD Sexually active: Yes Vaccines-   Flu: UTD  Tetanus: 07/06/2012- Due!   COVID19: x 2 HIV screening: Today Hep C Screening: Today Tobacco use: No Alcohol use: Yes, once a month Illicit Drug use: No Dentist: Yes Ophthalmology: No  Social History   Tobacco Use  Smoking Status Former   Current packs/day: 0.00   Average packs/day: 0.3 packs/day for 22.4 years (7.5 ttl pk-yrs)   Types: Cigarettes   Start date: 02/14/2003   Quit date: 02/13/2018   Years since quitting: 4.9  Smokeless Tobacco Never    Current Outpatient Medications on File Prior to Visit  Medication Sig Dispense Refill   atenolol (TENORMIN) 25 MG tablet Take 1 tablet (25 mg total) by mouth daily. 90 tablet 3   escitalopram (LEXAPRO) 20 MG tablet Take 1 tablet (20 mg total) by mouth daily. 90 tablet 3   levothyroxine (SYNTHROID) 200 MCG tablet Take 1 tablet (200 mcg total) by mouth daily. 90 tablet 1   traZODone (DESYREL) 50 MG tablet Take 1 tablet (50 mg total) by mouth at bedtime. 90 tablet 1   No current facility-administered medications on file prior to visit.    ROS see history of present illness  Objective  Physical Exam Vitals:   01/12/23 0814  BP: 126/80  Pulse: 77  Temp: 97.9 F (36.6 C)  SpO2: 97%    BP  Readings from Last 3 Encounters:  01/12/23 126/80  07/05/22 112/71  07/04/22 (!) 150/106   Wt Readings from Last 3 Encounters:  01/12/23 294 lb 12.8 oz (133.7 kg)  07/02/22 291 lb 12.8 oz (132.4 kg)  06/09/22 289 lb (131.1 kg)    Physical Exam Constitutional:      General: She is not in acute distress.    Appearance: Normal appearance. She is obese.  HENT:     Head: Normocephalic.     Right Ear: Tympanic membrane normal.     Left Ear: Tympanic membrane normal.     Nose: Nose normal.     Mouth/Throat:     Mouth: Mucous membranes are moist.     Pharynx: Oropharynx is clear.  Eyes:     Conjunctiva/sclera: Conjunctivae normal.     Pupils: Pupils are equal, round, and reactive to light.  Neck:     Thyroid: No thyromegaly.  Cardiovascular:     Rate and Rhythm: Normal rate and regular rhythm.     Heart sounds: Normal heart sounds.  Pulmonary:     Effort: Pulmonary effort is normal.     Breath sounds: Normal breath sounds.  Abdominal:     General: Abdomen is flat. Bowel sounds are normal.     Palpations: Abdomen is soft. There is no mass.     Tenderness: There is no abdominal tenderness.  Musculoskeletal:        General: Normal  range of motion.  Lymphadenopathy:     Cervical: No cervical adenopathy.  Skin:    General: Skin is warm and dry.     Findings: No rash.  Neurological:     General: No focal deficit present.     Mental Status: She is alert.  Psychiatric:        Mood and Affect: Mood normal.        Behavior: Behavior normal.    Assessment/Plan: Please see individual problem list.  Encounter for routine adult medical exam with abnormal findings Assessment & Plan: Physical exam complete. Lab work as outlined. Will contact patient with results. Pap- due, has appointment coming up with Ob-Gyn. Mammogram- UTD. Flu vaccine- UTD. Tetanus vaccine- due, given today in office. Declined additional COVID vaccines. HIV and Hep C screenings today in lab work. Recommended  follow ups with Dentist and Ophthalmology for annual exams. Encouraged healthy diet and exercise. Return to care in 3 months, sooner PRN.    Screen for STD (sexually transmitted disease) Assessment & Plan: Recent new partner. Will contact patient with results. Counseled on safe sex practices.   Orders: -     Cervicovaginal ancillary only  Left flank pain Assessment & Plan: Mixed flank/abdominal pain. Has been ongoing for several months. Negative CT Abd/Pelvis in April. She would like her urine rechecked today. Denies dysuria. Denies frequency and urgency. Will contact with results.   Orders: -     Urinalysis, Routine w reflex microscopic  Anxiety and depression Assessment & Plan: Chronic issue. Worsening anxiety due to life stressors. PHQ-13 and GAD- 18 today. Denies SI/HI. Will add Buspar 7.5 mg BID. She will continue Lexapro 20 mg daily. Counseled patient on common side effects. Encouraged to contact if worsening symptoms, unusual behavior changes or suicidal thoughts occur. Will continue to monitor.   Orders: -     busPIRone HCl; Take 1 tablet (7.5 mg total) by mouth 2 (two) times daily.  Dispense: 180 tablet; Refill: 1  Loose stools Assessment & Plan: Daily x 3 months. Denies diarrhea. Will refer to GI.   Orders: -     Ambulatory referral to Gastroenterology  Essential hypertension Assessment & Plan: Chronic. Stable on Atenolol 25 mg daily. Continue. Lab work as outlined.   Orders: -     CBC with Differential/Platelet -     Comprehensive metabolic panel  Hashimoto's thyroiditis Assessment & Plan: Chronic. Currently on Levothyroxine 200 mcg. Last TSH- 10.72. Will check thyroid panel today. If continues to be uncontrolled will refer to Endocrinology.   Orders: -     TSH+T4F+T3Free+ThyAbs+TPO+VD25  Moderate mixed hyperlipidemia not requiring statin therapy Assessment & Plan: The 10-year ASCVD risk score (Arnett DK, et al., 2019) is: 1.9%. Will check lipid panel today.  Encouraged healthy diet and exercise.   Orders: -     Lipid panel  Morbid obesity with BMI of 40.0-44.9, adult [facility]) Assessment & Plan: Will check A1c today. Encouraged healthy diet and exercise.   Orders: -     Hemoglobin A1c  Gastroesophageal reflux disease, unspecified whether esophagitis present -     Omeprazole; Take 1 capsule (40 mg total) by mouth daily.  Dispense: 90 capsule; Refill: 3  Encounter for hepatitis C screening test for low risk patient -     Hepatitis C antibody  Encounter for screening for HIV -     HIV Antibody (routine testing w rflx)  Need for Tdap vaccination -     Tdap vaccine greater than or equal to 7yo  IM  Other orders -     Albuterol Sulfate HFA; Inhale 1-2 puffs into the lungs every 6 (six) hours as needed for wheezing or shortness of breath.  Dispense: 8 g; Refill: 5   Return in about 3 months (around 04/14/2023) for Follow up.   Kelsey Dicker, NP-C [facility] [employer]

## 2023-01-13 ENCOUNTER — Other Ambulatory Visit: Payer: 59

## 2023-01-13 ENCOUNTER — Other Ambulatory Visit: Payer: Self-pay | Admitting: Nurse Practitioner

## 2023-01-13 DIAGNOSIS — R8290 Unspecified abnormal findings in urine: Secondary | ICD-10-CM | POA: Diagnosis not present

## 2023-01-13 DIAGNOSIS — A5901 Trichomonal vulvovaginitis: Secondary | ICD-10-CM

## 2023-01-13 DIAGNOSIS — E063 Autoimmune thyroiditis: Secondary | ICD-10-CM

## 2023-01-13 DIAGNOSIS — N760 Acute vaginitis: Secondary | ICD-10-CM

## 2023-01-13 LAB — CERVICOVAGINAL ANCILLARY ONLY
Bacterial Vaginitis (gardnerella): POSITIVE — AB
Candida Glabrata: NEGATIVE
Candida Vaginitis: NEGATIVE
Chlamydia: NEGATIVE
Comment: NEGATIVE
Comment: NEGATIVE
Comment: NEGATIVE
Comment: NEGATIVE
Comment: NEGATIVE
Comment: NORMAL
Neisseria Gonorrhea: NEGATIVE
Trichomonas: POSITIVE — AB

## 2023-01-13 LAB — HIV ANTIBODY (ROUTINE TESTING W REFLEX): HIV 1&2 Ab, 4th Generation: NONREACTIVE

## 2023-01-13 LAB — HEPATITIS C ANTIBODY: Hepatitis C Ab: NONREACTIVE

## 2023-01-13 MED ORDER — METRONIDAZOLE 500 MG PO TABS
500.0000 mg | ORAL_TABLET | Freq: Two times a day (BID) | ORAL | 0 refills | Status: AC
Start: 2023-01-13 — End: 2023-01-20

## 2023-01-14 LAB — URINE CULTURE
MICRO NUMBER:: 15638648
SPECIMEN QUALITY:: ADEQUATE

## 2023-01-18 ENCOUNTER — Other Ambulatory Visit: Payer: Self-pay | Admitting: Nurse Practitioner

## 2023-01-18 ENCOUNTER — Encounter: Payer: Self-pay | Admitting: Nurse Practitioner

## 2023-01-18 DIAGNOSIS — R109 Unspecified abdominal pain: Secondary | ICD-10-CM

## 2023-01-18 DIAGNOSIS — R3129 Other microscopic hematuria: Secondary | ICD-10-CM

## 2023-01-18 NOTE — Assessment & Plan Note (Signed)
Recent new partner. Will contact patient with results. Counseled on safe sex practices.

## 2023-01-18 NOTE — Assessment & Plan Note (Signed)
The 10-year ASCVD risk score (Arnett DK, et al., 2019) is: 1.9%. Will check lipid panel today. Encouraged healthy diet and exercise.

## 2023-01-18 NOTE — Assessment & Plan Note (Signed)
Will check A1c today. Encouraged healthy diet and exercise.  

## 2023-01-18 NOTE — Assessment & Plan Note (Signed)
Daily x 3 months. Denies diarrhea. Will refer to GI.

## 2023-01-18 NOTE — Assessment & Plan Note (Signed)
Chronic. Currently on Levothyroxine 200 mcg. Last TSH- 10.72. Will check thyroid panel today. If continues to be uncontrolled will refer to Endocrinology.

## 2023-01-18 NOTE — Assessment & Plan Note (Signed)
Mixed flank/abdominal pain. Has been ongoing for several months. Negative CT Abd/Pelvis in April. She would like her urine rechecked today. Denies dysuria. Denies frequency and urgency. Will contact with results.

## 2023-01-18 NOTE — Assessment & Plan Note (Signed)
Chronic issue. Worsening anxiety due to life stressors. PHQ-13 and GAD- 18 today. Denies SI/HI. Will add Buspar 7.5 mg BID. She will continue Lexapro 20 mg daily. Counseled patient on common side effects. Encouraged to contact if worsening symptoms, unusual behavior changes or suicidal thoughts occur. Will continue to monitor.

## 2023-01-18 NOTE — Assessment & Plan Note (Signed)
Chronic. Stable on Atenolol 25 mg daily. Continue. Lab work as outlined.

## 2023-01-18 NOTE — Assessment & Plan Note (Signed)
Physical exam complete. Lab work as outlined. Will contact patient with results. Pap- due, has appointment coming up with Ob-Gyn. Mammogram- UTD. Flu vaccine- UTD. Tetanus vaccine- due, given today in office. Declined additional COVID vaccines. HIV and Hep C screenings today in lab work. Recommended follow ups with Dentist and Ophthalmology for annual exams. Encouraged healthy diet and exercise. Return to care in 3 months, sooner PRN.

## 2023-01-19 ENCOUNTER — Emergency Department: Payer: 59

## 2023-01-19 ENCOUNTER — Emergency Department
Admission: EM | Admit: 2023-01-19 | Discharge: 2023-01-19 | Disposition: A | Discharge status: Home / Self Care | Payer: 59 | Attending: Emergency Medicine | Admitting: Emergency Medicine

## 2023-01-19 DIAGNOSIS — R110 Nausea: Secondary | ICD-10-CM | POA: Insufficient documentation

## 2023-01-19 DIAGNOSIS — R1032 Left lower quadrant pain: Secondary | ICD-10-CM | POA: Insufficient documentation

## 2023-01-19 DIAGNOSIS — M79606 Pain in leg, unspecified: Secondary | ICD-10-CM | POA: Insufficient documentation

## 2023-01-19 DIAGNOSIS — E039 Hypothyroidism, unspecified: Secondary | ICD-10-CM | POA: Insufficient documentation

## 2023-01-19 DIAGNOSIS — I10 Essential (primary) hypertension: Secondary | ICD-10-CM | POA: Diagnosis not present

## 2023-01-19 DIAGNOSIS — R109 Unspecified abdominal pain: Secondary | ICD-10-CM

## 2023-01-19 LAB — BASIC METABOLIC PANEL
Anion gap: 7 (ref 5–15)
BUN: 16 mg/dL (ref 6–20)
CO2: 24 mmol/L (ref 22–32)
Calcium: 8.7 mg/dL — ABNORMAL LOW (ref 8.9–10.3)
Chloride: 104 mmol/L (ref 98–111)
Creatinine, Ser: 0.64 mg/dL (ref 0.44–1.00)
GFR, Estimated: >60 mL/min (ref >60)
Glucose, Bld: 125 mg/dL — ABNORMAL HIGH (ref 70–99)
Potassium: 4.3 mmol/L (ref 3.5–5.1)
Sodium: 135 mmol/L (ref 135–145)

## 2023-01-19 LAB — URINALYSIS, ROUTINE W REFLEX MICROSCOPIC
Bacteria, UA: NONE SEEN
Bilirubin Urine: NEGATIVE
Glucose, UA: NEGATIVE mg/dL
Ketones, ur: NEGATIVE mg/dL
Nitrite: NEGATIVE
Protein, ur: NEGATIVE mg/dL
Specific Gravity, Urine: 1.018 (ref 1.005–1.030)
pH: 7 (ref 5.0–8.0)

## 2023-01-19 LAB — CBC
HCT: 40.4 % (ref 36.0–46.0)
Hemoglobin: 13.7 g/dL (ref 12.0–15.0)
MCH: 30 pg (ref 26.0–34.0)
MCHC: 33.9 g/dL (ref 30.0–36.0)
MCV: 88.6 fL (ref 80.0–100.0)
Platelets: 272 10*3/uL (ref 150–400)
RBC: 4.56 MIL/uL (ref 3.87–5.11)
RDW: 12.9 % (ref 11.5–15.5)
WBC: 11.6 10*3/uL — ABNORMAL HIGH (ref 4.0–10.5)
nRBC: 0 % (ref 0.0–0.2)

## 2023-01-19 LAB — POC URINE PREG, ED: Preg Test, Ur: NEGATIVE

## 2023-01-19 MED ORDER — CEPHALEXIN 500 MG PO CAPS: 1000.0000 mg | ORAL_CAPSULE | Freq: Two times a day (BID) | ORAL | 0 refills | Status: AC

## 2023-01-19 MED ORDER — KETOROLAC TROMETHAMINE 30 MG/ML IJ SOLN
15.0000 mg | Freq: Once | INTRAMUSCULAR | Status: AC
  Administered 2023-01-19: 15 mg via INTRAVENOUS
  Filled 2023-01-19: qty 1

## 2023-01-19 MED ORDER — ONDANSETRON 4 MG PO TBDP: 4.0000 mg | ORAL_TABLET | Freq: Three times a day (TID) | ORAL | 0 refills | Status: DC | PRN

## 2023-01-19 MED ORDER — ONDANSETRON HCL 4 MG/2ML IJ SOLN
4.0000 mg | Freq: Once | INTRAMUSCULAR | Status: AC
  Administered 2023-01-19: 4 mg via INTRAVENOUS
  Filled 2023-01-19: qty 2

## 2023-01-19 NOTE — ED Triage Notes (Signed)
PT arrives via POV. PT reports left flank pain and nausea for about 1 week. Pt states she has the urge to urinate but since this morning she has not been able to urinate. Pt is AxOx4.

## 2023-01-19 NOTE — ED Provider Notes (Signed)
[facility] Provider Note    Event Date/Time   First MD Initiated Contact with Patient 01/19/23 1027     (approximate)   History   Chief Complaint Flank Pain   HPI  Kelsey Reed is a 43 y.o. female with past medical history of hypertension and hypothyroidism who presents to the ED complaining of leg pain.  Patient reports that she initially had dull aching pain in her left flank starting 1 week ago, was seen by her PCP at that time and reportedly had an unremarkable urinalysis.  She states that the pain has increased since then, become much more severe over the past 24 hours.  It has been associated with nausea but she has not vomited and she denies any changes in her bowel movements.  She feels like she has been urinating more frequently but denies any dysuria or hematuria and has not had any fevers.  She reports similar symptoms in the past due to kidney stones.     Physical Exam   Triage Vital Signs: ED Triage Vitals  Encounter Vitals Group     BP 01/19/23 0946 (!) 142/90     Systolic BP Percentile --      Diastolic BP Percentile --      Pulse Rate 01/19/23 0946 79     Resp 01/19/23 0946 18     Temp 01/19/23 0946 98.1 F (36.7 C)     Temp Source 01/19/23 0946 Oral     SpO2 01/19/23 0946 94 %     Weight 01/19/23 0947 290 lb (131.5 kg)     Height 01/19/23 0947 5\' 8"  (1.727 m)     Head Circumference --      Peak Flow --      Pain Score 01/19/23 0947 10     Pain Loc --      Pain Education --      Exclude from Growth Chart --     Most recent vital signs: Vitals:   01/19/23 0946  BP: (!) 142/90  Pulse: 79  Resp: 18  Temp: 98.1 F (36.7 C)  SpO2: 94%    Constitutional: Alert and oriented. Eyes: Conjunctivae are normal. Head: Atraumatic. Nose: No congestion/rhinnorhea. Mouth/Throat: Mucous membranes are moist.  Cardiovascular: Normal rate, regular rhythm. Grossly normal heart sounds.  2+ radial pulses bilaterally. Respiratory: Normal  respiratory effort.  No retractions. Lungs CTAB. Gastrointestinal: Soft and tender to palpation in the left lower quadrant with no rebound or guarding, left CVA tenderness noted.  No distention. Musculoskeletal: No lower extremity tenderness nor edema.  Neurologic:  Normal speech and language. No gross focal neurologic deficits are appreciated.    ED Results / Procedures / Treatments   Labs (all labs ordered are listed, but only abnormal results are displayed) Labs Reviewed  URINALYSIS, ROUTINE W REFLEX MICROSCOPIC - Abnormal; Notable for the following components:      Result Value   Color, Urine YELLOW (*)    APPearance CLEAR (*)    Hgb urine dipstick SMALL (*)    Leukocytes,Ua TRACE (*)    All other components within normal limits  BASIC METABOLIC PANEL - Abnormal; Notable for the following components:   Glucose, Bld 125 (*)    Calcium 8.7 (*)    All other components within normal limits  CBC - Abnormal; Notable for the following components:   WBC 11.6 (*)    All other components within normal limits  URINE CULTURE  POC URINE PREG, ED  RADIOLOGY CT renal protocol reviewed and interpreted by me with no inflammatory changes, dilated bowel loops, or obstructing kidney stones.  PROCEDURES:  Critical Care performed: No  Procedures   MEDICATIONS ORDERED IN ED: Medications  ketorolac (TORADOL) 30 MG/ML injection 15 mg (15 mg Intravenous Given 01/19/23 1100)  ondansetron (ZOFRAN) injection 4 mg (4 mg Intravenous Given 01/19/23 1100)     IMPRESSION / MDM / ASSESSMENT AND PLAN / ED COURSE  I reviewed the triage vital signs and the nursing notes.                              42 y.o. female with past medical history of hypertension and hypothyroidism who presents to the ED with increasing left flank pain with urinary frequency for the past week.  Patient's presentation is most consistent with acute presentation with potential threat to life or bodily  function.  Differential diagnosis includes, but is not limited to, pyelonephritis, kidney stone, colitis, gastritis, anemia, electrolyte abnormality, AKI.  Patient nontoxic-appearing and in no acute distress, vital signs are unremarkable.  Her abdomen is soft but she does have tenderness in the left lower quadrant as well as her left costovertebral area.  We will further assess with CT renal protocol, treat symptomatically with IV Zofran and Toradol.  Pregnancy testing is negative and labs show no significant anemia, leukocytosis, electrolyte abnormality, or AKI.  Urinalysis pending at this time.  CT renal protocol is unremarkable with no evidence of kidney stone or other explanation for her pain.  No respiratory symptoms to suggest pneumonia and I doubt PE as she is PERC negative.  Urinalysis seems unlikely to represent infection but given no alternative explanation for her pain, we will send urine for culture and treat with course of antibiotics.  She was counseled to follow-up with PCP and to return to the ED for new or worsening symptoms.  Patient agrees with plan.      FINAL CLINICAL IMPRESSION(S) / ED DIAGNOSES   Final diagnoses:  Left flank pain     Rx / DC Orders   ED Discharge Orders          Ordered    ondansetron (ZOFRAN-ODT) 4 MG disintegrating tablet  Every 8 hours PRN        01/19/23 1218    cephALEXin (KEFLEX) 500 MG capsule  2 times daily        01/19/23 1218             Note:  This document was prepared using Dragon voice recognition software and may include unintentional dictation errors.   Chesley Noon, MD 01/19/23 [phone]

## 2023-01-20 ENCOUNTER — Telehealth: Payer: Self-pay | Admitting: Nurse Practitioner

## 2023-01-20 LAB — TSH+T4F+T3FREE+THYABS+TPO+VD25
TSH: 6.9 u[IU]/mL — ABNORMAL HIGH (ref 0.450–4.500)
Thyroglobulin Antibody: 3.8 [IU]/mL — ABNORMAL HIGH (ref 0.0–0.9)
Thyroperoxidase Ab SerPl-aCnc: 54 [IU]/mL — ABNORMAL HIGH (ref 0–34)
Vit D, 25-Hydroxy: 22.4 ng/mL — ABNORMAL LOW (ref 30.0–100.0)

## 2023-01-20 LAB — URINE CULTURE: Culture: NO GROWTH

## 2023-01-20 NOTE — Telephone Encounter (Signed)
Vm full . Thank you!

## 2023-01-21 ENCOUNTER — Telehealth: Payer: Self-pay

## 2023-01-21 NOTE — Transitions of Care (Post Inpatient/ED Visit) (Signed)
Unable to reach patient by phone and left v/m requesting call back at [phone].        01/21/2023  Name: Kelsey Reed MRN: [id number] DOB: [birth year]  Today's TOC FU Call Status: Today's TOC FU Call Status:: Unsuccessful Call (1st Attempt) Unsuccessful Call (1st Attempt) Date: 01/21/23  Attempted to reach the patient regarding the most recent Inpatient/ED visit.  Follow Up Plan: Additional outreach attempts will be made to reach the patient to complete the Transitions of Care (Post Inpatient/ED visit) call.   Signature  Lewanda Rife, LPN

## 2023-01-31 ENCOUNTER — Telehealth: Payer: Self-pay | Admitting: Nurse Practitioner

## 2023-01-31 NOTE — Telephone Encounter (Signed)
Lft pt vm to call ofc to sch CT. thanks 

## 2023-02-01 ENCOUNTER — Telehealth: Payer: Self-pay | Admitting: Nurse Practitioner

## 2023-02-01 NOTE — Telephone Encounter (Signed)
Lft pt vm to call ofc to sch CT. thanks 

## 2023-02-03 ENCOUNTER — Telehealth: Payer: Self-pay | Admitting: Nurse Practitioner

## 2023-02-03 NOTE — Telephone Encounter (Signed)
Lft pt vm to call ofc to sch ct.thanks

## 2023-03-08 ENCOUNTER — Telehealth: Payer: 59 | Admitting: Physician Assistant

## 2023-03-08 DIAGNOSIS — R531 Weakness: Secondary | ICD-10-CM

## 2023-03-08 DIAGNOSIS — M545 Low back pain, unspecified: Secondary | ICD-10-CM

## 2023-03-08 DIAGNOSIS — R5383 Other fatigue: Secondary | ICD-10-CM

## 2023-03-08 DIAGNOSIS — R630 Anorexia: Secondary | ICD-10-CM

## 2023-03-08 NOTE — Progress Notes (Signed)
Because you are having severe back pain with loss of appetite, weakness, and fatigue, I feel your condition warrants further evaluation and I recommend that you be seen in a face to face visit.   NOTE: There will be NO CHARGE for this eVisit   If you are having a true medical emergency please call 911.      For an urgent face to face visit, [facility] has eight urgent care centers for your convenience:   NEW!! [facility] Health Urgent Care Center at [facility] Get Driving Directions [phone] [address] [place], [address]    [facility] Health Urgent Care Center at [facility] Get Driving Directions [phone] [address] [place], [place] [address]   [facility] Health Urgent Care Center [facility]) Get Driving Directions [phone] [address] [place], [place] [address]  [facility] Health Urgent Care Center [facility]) Get Driving Directions [phone] [address] [place],  [place]  [address]  [facility] Health Urgent Care Center [facility] - at [employer]  [phone] [address] [place],  [place] [address]   [facility] Health Urgent Care at [facility] Get Driving Directions [phone] [address] [place], [place] [address]   [facility] Health Urgent Care at [facility] Get Driving Directions  [phone] [address] [place], [place] [address]   [facility] Health Urgent Care at [facility] Directions [phone] [address] [place], [place] [address]  Your MyChart E-visit questionnaire answers were reviewed by a board certified advanced clinical practitioner to complete your personal care plan based on your specific symptoms.  Thank you for using e-Visits.     I have spent 5 minutes in review of e-visit questionnaire, review and updating patient chart, medical decision making and response to patient.    Margaretann Loveless, PA-C

## 2023-03-09 DIAGNOSIS — M47896 Other spondylosis, lumbar region: Secondary | ICD-10-CM | POA: Diagnosis not present

## 2023-03-09 DIAGNOSIS — M47816 Spondylosis without myelopathy or radiculopathy, lumbar region: Secondary | ICD-10-CM | POA: Insufficient documentation

## 2023-03-09 DIAGNOSIS — M545 Low back pain, unspecified: Secondary | ICD-10-CM | POA: Insufficient documentation

## 2023-03-09 DIAGNOSIS — S39012A Strain of muscle, fascia and tendon of lower back, initial encounter: Secondary | ICD-10-CM | POA: Diagnosis not present

## 2023-03-22 ENCOUNTER — Telehealth: Payer: 59 | Admitting: Physician Assistant

## 2023-03-22 DIAGNOSIS — R112 Nausea with vomiting, unspecified: Secondary | ICD-10-CM | POA: Diagnosis not present

## 2023-03-22 MED ORDER — ONDANSETRON HCL 4 MG PO TABS: 4.0000 mg | ORAL_TABLET | Freq: Three times a day (TID) | ORAL | 0 refills | Status: DC | PRN

## 2023-03-22 NOTE — Progress Notes (Signed)

## 2023-04-04 ENCOUNTER — Other Ambulatory Visit: Payer: Self-pay | Admitting: Nurse Practitioner

## 2023-04-04 DIAGNOSIS — E063 Autoimmune thyroiditis: Secondary | ICD-10-CM

## 2023-04-15 ENCOUNTER — Ambulatory Visit: Payer: 59 | Admitting: Nurse Practitioner

## 2023-05-13 ENCOUNTER — Telehealth: Payer: 59 | Admitting: Physician Assistant

## 2023-05-13 DIAGNOSIS — R112 Nausea with vomiting, unspecified: Secondary | ICD-10-CM | POA: Diagnosis not present

## 2023-05-13 MED ORDER — ONDANSETRON HCL 4 MG PO TABS: 4.0000 mg | ORAL_TABLET | Freq: Three times a day (TID) | ORAL | 0 refills | Status: DC | PRN

## 2023-05-13 NOTE — Progress Notes (Signed)

## 2023-06-15 DIAGNOSIS — N898 Other specified noninflammatory disorders of vagina: Secondary | ICD-10-CM | POA: Diagnosis not present

## 2023-06-15 DIAGNOSIS — E039 Hypothyroidism, unspecified: Secondary | ICD-10-CM | POA: Insufficient documentation

## 2023-06-15 DIAGNOSIS — Z6841 Body Mass Index (BMI) 40.0 and over, adult: Secondary | ICD-10-CM | POA: Diagnosis not present

## 2023-06-15 DIAGNOSIS — Z975 Presence of (intrauterine) contraceptive device: Secondary | ICD-10-CM | POA: Insufficient documentation

## 2023-06-15 DIAGNOSIS — N760 Acute vaginitis: Secondary | ICD-10-CM | POA: Diagnosis not present

## 2023-06-15 DIAGNOSIS — Z1151 Encounter for screening for human papillomavirus (HPV): Secondary | ICD-10-CM | POA: Diagnosis not present

## 2023-06-15 DIAGNOSIS — Z309 Encounter for contraceptive management, unspecified: Secondary | ICD-10-CM | POA: Diagnosis not present

## 2023-06-15 DIAGNOSIS — Z124 Encounter for screening for malignant neoplasm of cervix: Secondary | ICD-10-CM | POA: Diagnosis not present

## 2023-06-15 DIAGNOSIS — Z113 Encounter for screening for infections with a predominantly sexual mode of transmission: Secondary | ICD-10-CM | POA: Diagnosis not present

## 2023-06-15 DIAGNOSIS — Z01419 Encounter for gynecological examination (general) (routine) without abnormal findings: Secondary | ICD-10-CM | POA: Diagnosis not present

## 2023-06-19 ENCOUNTER — Telehealth: Admitting: Nurse Practitioner

## 2023-06-19 DIAGNOSIS — R112 Nausea with vomiting, unspecified: Secondary | ICD-10-CM

## 2023-06-19 MED ORDER — ONDANSETRON HCL 4 MG PO TABS: 4.0000 mg | ORAL_TABLET | Freq: Three times a day (TID) | ORAL | 0 refills | Status: DC | PRN

## 2023-06-19 NOTE — Progress Notes (Signed)
E-Visit for Nausea and Vomiting   We are sorry that you are not feeling well. Here is how we plan to help!  Based on what you have shared with me it looks like you have a Virus that is irritating your GI tract.  Vomiting is the forceful emptying of a portion of the stomach's content through the mouth.  Although nausea and vomiting can make you feel miserable, it's important to remember that these are not diseases, but rather symptoms of an underlying illness.  When we treat short term symptoms, we always caution that any symptoms that persist should be fully evaluated in a medical office.  I have prescribed a medication that will help alleviate your symptoms and allow you to stay hydrated:  Zofran 4 mg 1 tablet every 8 hours as needed for nausea and vomiting  HOME CARE: Drink clear liquids.  This is very important! Dehydration (the lack of fluid) can lead to a serious complication.  Start off with 1 tablespoon every 5 minutes for 8 hours. You may begin eating bland foods after 8 hours without vomiting.  Start with saltine crackers, white bread, rice, mashed potatoes, applesauce. After 48 hours on a bland diet, you may resume a normal diet. Try to go to sleep.  Sleep often empties the stomach and relieves the need to vomit.  GET HELP RIGHT AWAY IF:  Your symptoms do not improve or worsen within 2 days after treatment. You have a fever for over 3 days. You cannot keep down fluids after trying the medication.  MAKE SURE YOU:  Understand these instructions. Will watch your condition. Will get help right away if you are not doing well or get worse.    Thank you for choosing an e-visit.  Your e-visit answers were reviewed by a board certified advanced clinical practitioner to complete your personal care plan. Depending upon the condition, your plan could have included both over the counter or prescription medications.  Please review your pharmacy choice. Make sure the pharmacy is open so  you can pick up prescription now. If there is a problem, you may contact your provider through MyChart messaging and have the prescription routed to another pharmacy.  Your safety is important to us. If you have drug allergies check your prescription carefully.   For the next 24 hours you can use MyChart to ask questions about today's visit, request a non-urgent call back, or ask for a work or school excuse. You will get an email in the next two days asking about your experience. I hope that your e-visit has been valuable and will speed your recovery.  

## 2023-06-19 NOTE — Progress Notes (Signed)
 I have spent 5 minutes in review of e-visit questionnaire, review and updating patient chart, medical decision making and response to patient.   Claiborne Rigg, NP

## 2023-07-12 ENCOUNTER — Telehealth: Admitting: Physician Assistant

## 2023-07-12 ENCOUNTER — Encounter: Payer: Self-pay | Admitting: Nurse Practitioner

## 2023-07-12 DIAGNOSIS — M545 Low back pain, unspecified: Secondary | ICD-10-CM | POA: Diagnosis not present

## 2023-07-12 MED ORDER — CYCLOBENZAPRINE HCL 10 MG PO TABS: 10.0000 mg | ORAL_TABLET | Freq: Three times a day (TID) | ORAL | 0 refills | Status: DC | PRN

## 2023-07-12 NOTE — Progress Notes (Signed)

## 2023-07-12 NOTE — Progress Notes (Signed)
 I have spent 5 minutes in review of e-visit questionnaire, review and updating patient chart, medical decision making and response to patient.   Piedad Climes, PA-C

## 2023-07-19 DIAGNOSIS — H5213 Myopia, bilateral: Secondary | ICD-10-CM | POA: Diagnosis not present

## 2023-07-19 DIAGNOSIS — H04123 Dry eye syndrome of bilateral lacrimal glands: Secondary | ICD-10-CM | POA: Diagnosis not present

## 2023-07-25 ENCOUNTER — Telehealth: Admitting: Nurse Practitioner

## 2023-07-25 DIAGNOSIS — M545 Low back pain, unspecified: Secondary | ICD-10-CM

## 2023-07-25 NOTE — Progress Notes (Signed)
  Because you have been seen for this illness in the past month, I feel your condition warrants further evaluation and I recommend that you be seen in a face-to-face visit.   NOTE: There will be NO CHARGE for this E-Visit   If you are having a true medical emergency, please call 911.     For an urgent face to face visit, [facility] has multiple urgent care centers for your convenience.  Click the link below for the full list of locations and hours, walk-in wait times, appointment scheduling options and driving directions:  Urgent Care - [place], [place], [place], [place], [place], [place]  [place]     Your MyChart E-visit questionnaire answers were reviewed by a board certified advanced clinical practitioner to complete your personal care plan based on your specific symptoms.    Thank you for using e-Visits.

## 2023-08-05 ENCOUNTER — Emergency Department
Admission: EM | Admit: 2023-08-05 | Discharge: 2023-08-05 | Disposition: A | Discharge status: Home / Self Care | Attending: Emergency Medicine | Admitting: Emergency Medicine

## 2023-08-05 ENCOUNTER — Emergency Department

## 2023-08-05 ENCOUNTER — Other Ambulatory Visit: Payer: Self-pay

## 2023-08-05 DIAGNOSIS — F4321 Adjustment disorder with depressed mood: Secondary | ICD-10-CM | POA: Diagnosis not present

## 2023-08-05 DIAGNOSIS — F331 Major depressive disorder, recurrent, moderate: Secondary | ICD-10-CM | POA: Diagnosis not present

## 2023-08-05 DIAGNOSIS — F418 Other specified anxiety disorders: Secondary | ICD-10-CM | POA: Diagnosis not present

## 2023-08-05 DIAGNOSIS — R109 Unspecified abdominal pain: Secondary | ICD-10-CM | POA: Diagnosis not present

## 2023-08-05 DIAGNOSIS — N281 Cyst of kidney, acquired: Secondary | ICD-10-CM | POA: Diagnosis not present

## 2023-08-05 DIAGNOSIS — I10 Essential (primary) hypertension: Secondary | ICD-10-CM | POA: Diagnosis not present

## 2023-08-05 DIAGNOSIS — Z0389 Encounter for observation for other suspected diseases and conditions ruled out: Secondary | ICD-10-CM | POA: Diagnosis not present

## 2023-08-05 DIAGNOSIS — F4323 Adjustment disorder with mixed anxiety and depressed mood: Secondary | ICD-10-CM | POA: Diagnosis not present

## 2023-08-05 DIAGNOSIS — R1011 Right upper quadrant pain: Secondary | ICD-10-CM | POA: Diagnosis not present

## 2023-08-05 DIAGNOSIS — F32A Depression, unspecified: Secondary | ICD-10-CM

## 2023-08-05 LAB — CBC
HCT: 42.9 % (ref 36.0–46.0)
Hemoglobin: 14.5 g/dL (ref 12.0–15.0)
MCH: 29.6 pg (ref 26.0–34.0)
MCHC: 33.8 g/dL (ref 30.0–36.0)
MCV: 87.6 fL (ref 80.0–100.0)
Platelets: 283 10*3/uL (ref 150–400)
RBC: 4.9 MIL/uL (ref 3.87–5.11)
RDW: 12.9 % (ref 11.5–15.5)
WBC: 11.4 10*3/uL — ABNORMAL HIGH (ref 4.0–10.5)
nRBC: 0 % (ref 0.0–0.2)

## 2023-08-05 LAB — HEPATIC FUNCTION PANEL
ALT: 31 U/L (ref 0–44)
AST: 21 U/L (ref 15–41)
Albumin: 4.3 g/dL (ref 3.5–5.0)
Alkaline Phosphatase: 91 U/L (ref 38–126)
Bilirubin, Direct: <0.1 mg/dL (ref 0.0–0.2)
Total Bilirubin: 0.7 mg/dL (ref 0.0–1.2)
Total Protein: 7.9 g/dL (ref 6.5–8.1)

## 2023-08-05 LAB — BASIC METABOLIC PANEL WITH GFR
Anion gap: 12 (ref 5–15)
BUN: 11 mg/dL (ref 6–20)
CO2: 21 mmol/L — ABNORMAL LOW (ref 22–32)
Calcium: 9.3 mg/dL (ref 8.9–10.3)
Chloride: 104 mmol/L (ref 98–111)
Creatinine, Ser: 0.57 mg/dL (ref 0.44–1.00)
GFR, Estimated: >60 mL/min (ref >60)
Glucose, Bld: 113 mg/dL — ABNORMAL HIGH (ref 70–99)
Potassium: 4 mmol/L (ref 3.5–5.1)
Sodium: 137 mmol/L (ref 135–145)

## 2023-08-05 LAB — URINALYSIS, ROUTINE W REFLEX MICROSCOPIC
Bilirubin Urine: NEGATIVE
Glucose, UA: NEGATIVE mg/dL
Ketones, ur: 5 mg/dL — AB
Leukocytes,Ua: NEGATIVE
Nitrite: NEGATIVE
Protein, ur: NEGATIVE mg/dL
Specific Gravity, Urine: 1.017 (ref 1.005–1.030)
pH: 5 (ref 5.0–8.0)

## 2023-08-05 LAB — PREGNANCY, URINE: Preg Test, Ur: NEGATIVE

## 2023-08-05 LAB — TROPONIN I (HIGH SENSITIVITY)
Troponin I (High Sensitivity): 2 ng/L (ref <18)
Troponin I (High Sensitivity): <2 ng/L (ref <18)

## 2023-08-05 LAB — LIPASE, BLOOD: Lipase: 30 U/L (ref 11–51)

## 2023-08-05 LAB — D-DIMER, QUANTITATIVE: D-Dimer, Quant: 0.55 ug{FEU}/mL — ABNORMAL HIGH (ref 0.00–0.50)

## 2023-08-05 MED ORDER — MORPHINE SULFATE (PF) 4 MG/ML IV SOLN
4.0000 mg | Freq: Once | INTRAVENOUS | Status: AC
  Administered 2023-08-05: 4 mg via INTRAVENOUS
  Filled 2023-08-05: qty 1

## 2023-08-05 MED ORDER — ONDANSETRON HCL 4 MG/2ML IJ SOLN
4.0000 mg | Freq: Once | INTRAMUSCULAR | Status: AC
  Administered 2023-08-05: 4 mg via INTRAVENOUS
  Filled 2023-08-05: qty 2

## 2023-08-05 MED ORDER — KETOROLAC TROMETHAMINE 30 MG/ML IJ SOLN
15.0000 mg | Freq: Once | INTRAMUSCULAR | Status: AC
  Administered 2023-08-05: 15 mg via INTRAVENOUS
  Filled 2023-08-05: qty 1

## 2023-08-05 MED ORDER — IOHEXOL 350 MG/ML SOLN
75.0000 mL | Freq: Once | INTRAVENOUS | Status: AC | PRN
  Administered 2023-08-05: 100 mL via INTRAVENOUS

## 2023-08-05 MED ORDER — LIDOCAINE 5 % EX PTCH
1.0000 | MEDICATED_PATCH | CUTANEOUS | Status: DC
  Administered 2023-08-05: 1 via TRANSDERMAL
  Filled 2023-08-05: qty 1

## 2023-08-05 NOTE — ED Notes (Signed)
 Red top sent to lab also

## 2023-08-05 NOTE — ED Triage Notes (Addendum)
 Pt comes with back side flank pain that radiates to right side. Pt states nausea. Pt states this started last week and has gotten worse.   Pt also states SI thoughts. Pt denies any plan currently. Pt states life has been a lot and she suffers from depression. Pt states she has found that he daughter has been receiving pictures from men appropriately and Police are now involved. Pt states she feels scared and having anxiety. Pt states she hasn't been sleeping. Pt states she is on meds for the depression but it isn't helping.

## 2023-08-05 NOTE — ED Notes (Signed)
 Dr. Jessup gave a verbal order to hold on the administration of the Toradol  until the pregnancy test comes back negative.

## 2023-08-05 NOTE — ED Notes (Signed)
 Pt dressed out into hospital attire. Pt belongings to include: 1 pink shirt 1 black jacket 2 white/pink shoes 1 blue pants 1 work badge 2 black socks 1 black watch 1 black bra 1 yellow necklace 1 blue cellphone 1 pair of airpods 1 sunglasses

## 2023-08-05 NOTE — ED Notes (Signed)
 Troponin, Lipase and Hepatic Function Panel to be added on to previously collected lab.

## 2023-08-05 NOTE — ED Notes (Signed)
 Family at bedside.

## 2023-08-05 NOTE — Progress Notes (Signed)
   Per TTS, [facility] provided outpatient service resources for patient at [facility].  Pt will reach out for an appointment once schedule is reviewed.   Macky Sayres, [facility]

## 2023-08-05 NOTE — ED Notes (Signed)
 Pt left prior to obtaining discharge vital signs. Kelsey Reed.The patient is A&OX4, ambulatory at d/c with independent steady gait, NAD. Pt verbalized understanding of d/c instructions and follow up care.

## 2023-08-05 NOTE — BH Assessment (Signed)
 Comprehensive Clinical Assessment (CCA) Screening, Triage and Referral Note  08/05/2023 Kelsey Reed [id number]  Chief Complaint:  Chief Complaint  Patient presents with   Flank Pain   Visit Diagnosis: Depressive Disorder  Kelsey Reed is a 43 year old female who presents to the ER due to increase symptoms of depression and passive SI. She has dealt with depression for several years. However, the recent discovery of inappropriate pictures and messages on her twelve-year-old daughters' tablet has triggered her. The daughter has been asked by adult men to forwarded her nude pictures of herself and they have sent her nude pictures of themselves. This has caused the patient to feel like she has failed as a mother and haven't protected her children. She further reports, she has thoughts that her children "will be better off without me." She only has those thoughts when she's overwhelmed. She explains, she wouldn't do it because she has no desire to end her life, she doesn't want to cause additional pain to her children's lives and she know she need to be here (alive) to protect them. The patient has law enforcement involved and they have the tablet in their possession. Patient denies HI and AV/H.  Patient Reported Information How did you hear about us ? Family/Friend  What Is the Reason for Your Visit/Call Today? Brought to the ER due to increase symptoms of depression and passive SI.  How Long Has This Been Causing You Problems? 1 wk - 1 month  What Do You Feel Would Help You the Most Today? Treatment for Depression or other mood problem   Have You Recently Had Any Thoughts About Hurting Yourself? Yes  Are You Planning to Commit Suicide/Harm Yourself At This time? No   Have you Recently Had Thoughts About Hurting Someone Kelsey Reed? No  Are You Planning to Harm Someone at This Time? No  Explanation: No data recorded  Have You Used Any Alcohol or Drugs in the Past 24 Hours? No  How Long  Ago Did You Use Drugs or Alcohol? No data recorded What Did You Use and How Much? No data recorded  Do You Currently Have a Therapist/Psychiatrist? No  Name of Therapist/Psychiatrist: No data recorded  Have You Been Recently Discharged From Any Office Practice or Programs? No  Explanation of Discharge From Practice/Program: No data recorded   CCA Screening Triage Referral Assessment Type of Contact: Face-to-Face  Telemedicine Service Delivery:   Is this Initial or Reassessment?   Date Telepsych consult ordered in [facility]:    Time Telepsych consult ordered in [facility]:    Location of Assessment: [facility] ED  Provider Location: [facility] ED  Collateral Involvement: No data recorded  Does Patient Have a Court Appointed Legal Guardian? No data recorded Name and Contact of Legal Guardian: No data recorded If Minor and Not Living with Parent(s), Who has Custody? No data recorded Is CPS involved or ever been involved? Never  Is APS involved or ever been involved? Never   Patient Determined To Be At Risk for Harm To Self or Others Based on Review of Patient Reported Information or Presenting Complaint? No  Method: No data recorded Availability of Means: No data recorded Intent: No data recorded Notification Required: No data recorded Additional Information for Danger to Others Potential: No data recorded Additional Comments for Danger to Others Potential: No data recorded Are There Guns or Other Weapons in Your Home? No  Types of Guns/Weapons: No data recorded Are These Weapons Safely Secured?  No data recorded Who Could Verify You Are Able To Have These Secured: No data recorded Do You Have any Outstanding Charges, Pending Court Dates, Parole/Probation? No data recorded Contacted To Inform of Risk of Harm To Self or Others: No data recorded  Does Patient Present under Involuntary Commitment? No   County of Residence: [place]   Patient Currently Receiving the  Following Services: Medication Management   Determination of Need: Emergent (2 hours)   Options For Referral: ED Referral; Outpatient Therapy   Disposition Recommendation per psychiatric provider: Plan Post Discharge/Psychiatric Care Follow-up resources , provided by the [facility] Coordinator.  Kelsey Captain MS, LCAS, [facility], [facility] Therapeutic Triage Specialist 08/05/2023 4:52 PM
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
## 2023-08-08 ENCOUNTER — Ambulatory Visit: Admitting: Family Medicine

## 2023-08-08 ENCOUNTER — Encounter: Payer: Self-pay | Admitting: Family Medicine

## 2023-08-08 VITALS — BP 136/82 | HR 85 | Temp 97.8°F | Resp 20 | Ht 68.0 in | Wt 292.1 lb

## 2023-08-08 DIAGNOSIS — F39 Unspecified mood [affective] disorder: Secondary | ICD-10-CM

## 2023-08-08 DIAGNOSIS — E063 Autoimmune thyroiditis: Secondary | ICD-10-CM | POA: Diagnosis not present

## 2023-08-08 DIAGNOSIS — E785 Hyperlipidemia, unspecified: Secondary | ICD-10-CM | POA: Diagnosis not present

## 2023-08-08 DIAGNOSIS — I10 Essential (primary) hypertension: Secondary | ICD-10-CM | POA: Diagnosis not present

## 2023-08-08 DIAGNOSIS — E559 Vitamin D deficiency, unspecified: Secondary | ICD-10-CM

## 2023-08-08 DIAGNOSIS — E039 Hypothyroidism, unspecified: Secondary | ICD-10-CM

## 2023-08-08 DIAGNOSIS — F4323 Adjustment disorder with mixed anxiety and depressed mood: Secondary | ICD-10-CM | POA: Insufficient documentation

## 2023-08-08 DIAGNOSIS — E538 Deficiency of other specified B group vitamins: Secondary | ICD-10-CM | POA: Diagnosis not present

## 2023-08-08 DIAGNOSIS — R7309 Other abnormal glucose: Secondary | ICD-10-CM | POA: Insufficient documentation

## 2023-08-08 LAB — COMPREHENSIVE METABOLIC PANEL WITH GFR
ALT: 18 U/L (ref 0–35)
AST: 12 U/L (ref 0–37)
Albumin: 4 g/dL (ref 3.5–5.2)
Alkaline Phosphatase: 99 U/L (ref 39–117)
BUN: 11 mg/dL (ref 6–23)
CO2: 22 meq/L (ref 19–32)
Calcium: 8.8 mg/dL (ref 8.4–10.5)
Chloride: 104 meq/L (ref 96–112)
Creatinine, Ser: 0.52 mg/dL (ref 0.40–1.20)
GFR: 115.48 mL/min (ref >60.00)
Glucose, Bld: 107 mg/dL — ABNORMAL HIGH (ref 70–99)
Potassium: 4 meq/L (ref 3.5–5.1)
Sodium: 137 meq/L (ref 135–145)
Total Bilirubin: 0.3 mg/dL (ref 0.2–1.2)
Total Protein: 6.8 g/dL (ref 6.0–8.3)

## 2023-08-08 LAB — CBC WITH DIFFERENTIAL/PLATELET
Basophils Absolute: 0.1 10*3/uL (ref 0.0–0.1)
Basophils Relative: 0.8 % (ref 0.0–3.0)
Eosinophils Absolute: 0.3 10*3/uL (ref 0.0–0.7)
Eosinophils Relative: 3.1 % (ref 0.0–5.0)
HCT: 40.7 % (ref 36.0–46.0)
Hemoglobin: 13.4 g/dL (ref 12.0–15.0)
Lymphocytes Relative: 24.2 % (ref 12.0–46.0)
Lymphs Abs: 2.7 10*3/uL (ref 0.7–4.0)
MCHC: 33 g/dL (ref 30.0–36.0)
MCV: 87.3 fl (ref 78.0–100.0)
Monocytes Absolute: 0.4 10*3/uL (ref 0.1–1.0)
Monocytes Relative: 4 % (ref 3.0–12.0)
Neutro Abs: 7.6 10*3/uL (ref 1.4–7.7)
Neutrophils Relative %: 67.9 % (ref 43.0–77.0)
Platelets: 240 10*3/uL (ref 150.0–400.0)
RBC: 4.66 Mil/uL (ref 3.87–5.11)
RDW: 14 % (ref 11.5–15.5)
WBC: 11.2 10*3/uL — ABNORMAL HIGH (ref 4.0–10.5)

## 2023-08-08 LAB — LIPID PANEL
Cholesterol: 178 mg/dL (ref 0–200)
HDL: 36.9 mg/dL — ABNORMAL LOW (ref >39.00)
LDL Cholesterol: 115 mg/dL — ABNORMAL HIGH (ref 0–99)
NonHDL: 141.02
Total CHOL/HDL Ratio: 5
Triglycerides: 128 mg/dL (ref 0.0–149.0)
VLDL: 25.6 mg/dL (ref 0.0–40.0)

## 2023-08-08 LAB — HEMOGLOBIN A1C: Hgb A1c MFr Bld: 6.1 % (ref 4.6–6.5)

## 2023-08-08 MED ORDER — LORAZEPAM 0.5 MG PO TABS: 0.5000 mg | ORAL_TABLET | Freq: Two times a day (BID) | ORAL | 0 refills | Status: DC | PRN

## 2023-08-08 MED ORDER — ESCITALOPRAM OXALATE 20 MG PO TABS: 20.0000 mg | ORAL_TABLET | Freq: Every day | ORAL | 0 refills | Status: DC

## 2023-08-08 MED ORDER — HYDROXYZINE PAMOATE 25 MG PO CAPS: 25.0000 mg | ORAL_CAPSULE | Freq: Three times a day (TID) | ORAL | 0 refills | Status: AC | PRN

## 2023-08-08 NOTE — Assessment & Plan Note (Signed)
 Check Vitamin B 12 level

## 2023-08-08 NOTE — Assessment & Plan Note (Signed)
 Check TSH Continue Levothyroxine  200 mcg

## 2023-08-08 NOTE — Patient Instructions (Addendum)
 It was a pleasure meeting you today. Thank you for allowing me to take part in your health care.  Our goals for today as we discussed include:  We will get some labs today.  If they are abnormal or we need to do something about them, I will call you.  If they are normal, I will send you a message on MyChart (if it is active) or a letter in the mail.  If you don't hear from us  in 2 weeks, please call the office at the number below.   Ativan  0.5 mg two times a day as needed.  Use sparingly.   Continue Lexapro  20 mg daily Atarax  25 mg three times a day as needed Referral sent to psychiatry Referral sent to psychology  Can also call Dr Bradford Cadet office to see if can schedule appointment for psychiatry.   [address], [place], [place] [id number]  Call Employee Assistance at [facility] for therapy.  For Mental Health Concerns  [facility] [phone] Address: [address] [place], [place] [address] Hours: Open 24/7, No appointment required.    Benzodiazepine use is associated with an increased risk of falls, hip fractures, and mobility problems in older adults. In addition it may be associated with an increased risk of disability in mobility and activities of daily living in this population.  Other side effects include: Decreased libido, weight gain, weight loss,increased/decreased appetite,constipation,cognitive dysfunction,memory impairment, irritability  Sited from [employer] and Up To Date   Follow up with PCP in 1 week   This is a list of the screening recommended for you and due dates:  Health Maintenance  Topic Date Due   Pap with HPV screening  Never done   COVID-19 Vaccine (4 - 2024-25 season) 11/21/2022   Flu Shot  10/21/2023   DTaP/Tdap/Td vaccine (5 - Td or Tdap) 01/11/2033   Hepatitis C Screening  Completed   HIV Screening  Completed   HPV Vaccine  Aged Out   Meningitis B Vaccine  Aged Out     If you have any questions or concerns, please do not  hesitate to call the office at [phone].  I look forward to our next visit and until then take care and stay safe.  Regards,   Valli Gaw, MD   [facility]

## 2023-08-08 NOTE — Assessment & Plan Note (Addendum)
 Check fasting lipids

## 2023-08-08 NOTE — Assessment & Plan Note (Signed)
 Check Vitamin D level

## 2023-08-08 NOTE — Assessment & Plan Note (Signed)
 Elevated BMI Follow up with PCP for further discussion

## 2023-08-08 NOTE — Progress Notes (Signed)
 SUBJECTIVE:   Chief Complaint  Patient presents with   Anxiety   HPI Presents for acute visit  Discussed the use of AI scribe software for clinical note transcription with the patient, who gave verbal consent to proceed.  History of Present Illness Kelsey Reed is a 43 year old female with depression who presents with worsening anxiety and depression symptoms.  She has a long-standing history of depression, managed with citalopram for approximately 16-17 years. Her current dose is 20 mg daily, increased from 10 mg last year. She was also prescribed BuSpar  7.5 mg, but found it ineffective. She describes her current state as 'very anxious' with a mind that 'won't stop', impacting her ability to work and care for her daughter. Recent personal stressors and the cost of living have exacerbated her symptoms, making her feel overwhelmed.  She recently experienced a mental breakdown following an incident involving her daughter, which led her to seek emergency care. She attempted to contact psychiatry but was unable to get an appointment without a referral. She has a history of suicidal thoughts, particularly during dark times, but states her children prevent her from acting on these thoughts. She attempted self-harm approximately 25 years ago. No current plan for self-harm.  Her daughter, aged 22, was involved in a distressing situation involving inappropriate online interactions, which has added to her stress.  She has a history of hypertension, previously managed with atenolol  since 2017. She has not taken it recently as her blood pressure has stabilized post-divorce, with home readings usually around 135/85 mmHg. She also reports a family history of bipolar disorder.  She experiences difficulty sleeping, with trazodone  being ineffective in the past. She feels tired from her current medication regimen and notes a lack of improvement in her depressive symptoms. She has not had recent blood work  but was last checked for thyroid  function last year, with an adjustment to 200 mcg of her thyroid  medication at that time.     PERTINENT PMH / PSH: As above  OBJECTIVE:  BP 136/82   Pulse 85   Temp 97.8 F (36.6 C)   Resp 20   Ht 5\' 8"  (1.727 m)   Wt 292 lb 2 oz (132.5 kg)   SpO2 97%   BMI 44.42 kg/m    Physical Exam Vitals reviewed.  Constitutional:      General: She is not in acute distress.    Appearance: She is obese. She is not ill-appearing.  HENT:     Head: Normocephalic.     Nose: Nose normal.     Mouth/Throat:     Mouth: Mucous membranes are moist.  Eyes:     Extraocular Movements: Extraocular movements intact.     Conjunctiva/sclera: Conjunctivae normal.     Pupils: Pupils are equal, round, and reactive to light.  Neck:     Vascular: No carotid bruit.  Cardiovascular:     Rate and Rhythm: Normal rate.     Pulses: Normal pulses.  Pulmonary:     Effort: Pulmonary effort is normal.  Abdominal:     General: There is no distension.     Tenderness: There is no abdominal tenderness. There is no right CVA tenderness, left CVA tenderness, guarding or rebound.  Musculoskeletal:     Cervical back: Normal range of motion.     Right lower leg: No edema.     Left lower leg: No edema.  Lymphadenopathy:     Cervical: No cervical adenopathy.  Neurological:  General: No focal deficit present.     Mental Status: She is alert and oriented to person, place, and time. Mental status is at baseline.     Motor: No weakness.  Psychiatric:        Attention and Perception: Attention normal.        Mood and Affect: Mood is depressed. Affect is tearful.        Speech: Speech normal.        Behavior: Behavior normal.        Thought Content: Thought content includes suicidal ideation. Thought content does not include homicidal ideation. Thought content does not include homicidal or suicidal plan.        Judgment: Judgment normal.           08/08/2023   11:13 AM  01/12/2023    8:18 AM 06/09/2022    1:49 PM  Depression screen PHQ 2/9  Decreased Interest 3 0 2  Down, Depressed, Hopeless 3 2 2   PHQ - 2 Score 6 2 4   Altered sleeping 3 2 3   Tired, decreased energy 3 3 3   Change in appetite 3 3 3   Feeling bad or failure about yourself  3 2 2   Trouble concentrating 3 1 3   Moving slowly or fidgety/restless 1 0 1  Suicidal thoughts 1 0 0  PHQ-9 Score 23 13 19   Difficult doing work/chores Extremely dIfficult Very difficult Very difficult      08/08/2023   11:13 AM 01/12/2023    8:18 AM 06/09/2022    1:50 PM  GAD 7 : Generalized Anxiety Score  Nervous, Anxious, on Edge 3 2 3   Control/stop worrying 3 3 3   Worry too much - different things 3 3 3   Trouble relaxing 3 3 3   Restless 3 3 2   Easily annoyed or irritable 3 2 2   Afraid - awful might happen 3  2  Total GAD 7 Score 21  18  Anxiety Difficulty Extremely difficult Very difficult Very difficult    ASSESSMENT/PLAN:  Mood disorder (HCC) Assessment & Plan: Chronic depression exacerbated by life stressors. Current treatment with citalopram and BuSpar  ineffective. Increased anxiety, difficulty concentrating, and insomnia noted. History of suicidal ideation but currently supported by family. No self-harm plan. Short-term benzodiazepine use considered. Elevated PHQ9/GAD.  Positive MDQ screening, family history of Bipolar disorder - Prescribed Ativan  for short-term acute anxiety management. - Prescribed Atarax  for daytime anxiety. - Referred to psychiatry for further evaluation. Reached out to NP who had evaluated in ED and will reach out to SW for assistance to schedule appointment - Provided information on Employee Assistance Program. - Consider Mood stabilizer - Refill Lexapro  20 mg daily  Orders: -     LORazepam ; Take 1 tablet (0.5 mg total) by mouth 2 (two) times daily as needed for anxiety.  Dispense: 15 tablet; Refill: 0 -     Ambulatory referral to Psychiatry -     hydrOXYzine  Pamoate; Take  1 capsule (25 mg total) by mouth every 8 (eight) hours as needed.  Dispense: 30 capsule; Refill: 0 -     Escitalopram  Oxalate; Take 1 tablet (20 mg total) by mouth daily.  Dispense: 30 tablet; Refill: 0 -     Ambulatory referral to Psychology  Essential hypertension Assessment & Plan: Well controlled Has not been taking Atenolol  for months Check Cmet    Orders: -     Comprehensive metabolic panel with GFR  Hashimoto's thyroiditis -     TSH -  CBC with Differential/Platelet  Acquired hypothyroidism Assessment & Plan: Check TSH Continue Levothyroxine  200 mcg   Vitamin D  deficiency Assessment & Plan: Check Vitamin D  level  Orders: -     VITAMIN D  25 Hydroxy (Vit-D Deficiency, Fractures)  Vitamin B 12 deficiency Assessment & Plan: Check Vitamin B 12 level   Orders: -     Vitamin B12  Hyperlipidemia, unspecified hyperlipidemia type Assessment & Plan: Check fasting lipids   Orders: -     Lipid panel  Abnormal glucose -     Hemoglobin A1c  Morbid obesity (HCC) Assessment & Plan: Elevated BMI Follow up with PCP for further discussion    PDMP reviewed  Return in about 2 weeks (around 08/22/2023) for PCP, Mood.  Valli Gaw, MD

## 2023-08-08 NOTE — Assessment & Plan Note (Addendum)
 Well controlled Has not been taking Atenolol  for months Check Cmet

## 2023-08-08 NOTE — Assessment & Plan Note (Addendum)
 Chronic depression exacerbated by life stressors. Current treatment with citalopram and BuSpar  ineffective. Increased anxiety, difficulty concentrating, and insomnia noted. History of suicidal ideation but currently supported by family. No self-harm plan. Short-term benzodiazepine use considered. Elevated PHQ9/GAD.  Positive MDQ screening, family history of Bipolar disorder - Prescribed Ativan  for short-term acute anxiety management. - Prescribed Atarax  for daytime anxiety. - Referred to psychiatry for further evaluation. Reached out to NP who had evaluated in ED and will reach out to SW for assistance to schedule appointment - Provided information on Employee Assistance Program. - Consider Mood stabilizer - Refill Lexapro  20 mg daily

## 2023-08-09 LAB — VITAMIN B12: Vitamin B-12: 288 pg/mL (ref 211–911)

## 2023-08-09 LAB — VITAMIN D 25 HYDROXY (VIT D DEFICIENCY, FRACTURES): VITD: 14.23 ng/mL — ABNORMAL LOW (ref 30.00–100.00)

## 2023-08-09 LAB — TSH: TSH: 4.57 u[IU]/mL (ref 0.35–5.50)

## 2023-08-10 ENCOUNTER — Encounter: Payer: Self-pay | Admitting: Family Medicine

## 2023-08-10 ENCOUNTER — Telehealth (INDEPENDENT_AMBULATORY_CARE_PROVIDER_SITE_OTHER): Admitting: Family Medicine

## 2023-08-10 ENCOUNTER — Ambulatory Visit: Payer: Self-pay | Admitting: Family Medicine

## 2023-08-10 ENCOUNTER — Telehealth: Payer: Self-pay | Admitting: Nurse Practitioner

## 2023-08-10 VITALS — Resp 20 | Ht 68.0 in | Wt 292.1 lb

## 2023-08-10 DIAGNOSIS — E559 Vitamin D deficiency, unspecified: Secondary | ICD-10-CM | POA: Diagnosis not present

## 2023-08-10 DIAGNOSIS — F39 Unspecified mood [affective] disorder: Secondary | ICD-10-CM

## 2023-08-10 DIAGNOSIS — E538 Deficiency of other specified B group vitamins: Secondary | ICD-10-CM | POA: Diagnosis not present

## 2023-08-10 MED ORDER — VITAMIN D (ERGOCALCIFEROL) 1.25 MG (50000 UNIT) PO CAPS
50000.0000 [IU] | ORAL_CAPSULE | ORAL | 1 refills | Status: AC
Start: 2023-08-10 — End: ?
  Filled 2023-12-01: qty 4, 28d supply, fill #0

## 2023-08-10 NOTE — Telephone Encounter (Signed)
 Copied from CRM [id number]. Topic: Clinical - Lab/Test Results >> Aug 10, 2023  9:01 AM Ivette P wrote: Reason for CRM: Pt called in to follow up on lab results.    Read as follows: "Vitamin D  low. Start Vitamin D  1.25 mg weekly for 6 months then switch to over the counter Vitamin D  800iu daily. LDL (bad) cholesterol was 045 which is also elevated (nml is <99). Decreased from last visit. You can decrease these numbers by: -limiting your intake of processed foods and foods high in saturated fats. -increasing your physical activity -increasing your intake of vegetables, if not allergic, healthy fish (bluefin tuna, wild salmon, and sardines), whole grains and fiber rich foods .  -You can also use extra virgin olive oil instead of butter. -If you smoke, quitting can decrease LDL cholesterol and raise HDL cholesterol. Can follow up with PCP for continued discussion TSH normal. Continue current medication. Follow up PCP for management A1c 6.1. Unchanged. Prediabetes range. Consider Metformin for weight loss. Can discuss with PCP Please remind her of appointment with Dr Tere Felts on May 29. Please schedule appointment with me for completion of FMLA forms. Thanks"   Pt understood no further questions.

## 2023-08-10 NOTE — Telephone Encounter (Signed)
 Called pt to collect copay for video visit today, straight to vm, vm full. Unable to leave message. [facility]

## 2023-08-10 NOTE — Progress Notes (Unsigned)
 Virtual Visit via Video note  I connected with Kelsey Reed on 08/15/23 at 255pm by video and verified that I am speaking with the correct person using two identifiers. Kelsey Reed is currently located at work and is currently alone with her during visit. The provider, Valli Gaw, MD is located in their office at time of visit.  I discussed the limitations, risks, security and privacy concerns of performing an evaluation and management service by video and the availability of in person appointments. I also discussed with the patient that there may be a patient responsible charge related to this service. The patient expressed understanding and agreed to proceed.  Subjective: PCP: Bluford Burkitt, NP  Chief Complaint  Patient presents with   Form Completion    FMLA    HPI Discussed the use of AI scribe software for clinical note transcription with the patient, who gave verbal consent to proceed.  History of Present Illness Kelsey Reed is a 43 year old female who presents for follow-up regarding her intermittent leave request and medication management for anxiety.  She is experiencing ongoing anxiety triggered by various factors, making it difficult to predict when she will need time off. She describes the anxiety as 'constant in my mind of like what's going on.'  She has been prescribed Ativan  and hydroxyzine  for her anxiety. She uses hydroxyzine  as needed, particularly at night to aid sleep, and finds it helpful. Ativan  is used sparingly for more severe anxiety episodes.  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 Her anxiety affects her ability to focus on patient care, necessitating intermittent leave. She estimates needing time off approximately two times a week, with each episode lasting about two days.  She has upcoming appointments with psychiatry on Aug 18, 2023, and a follow-up with Gabriel John on August 23, 2023, to further address her condition and medication management.  No current thoughts of self-harm. Being at work helps keep her mind busy.  ROS: Per HPI  Current Outpatient Medications:    albuterol  (VENTOLIN  HFA) 108 (90 Base) MCG/ACT inhaler, Inhale 1-2 puffs into the lungs every 6 (six) hours as needed for wheezing or shortness of breath., Disp: 8 g, Rfl: 5   atenolol  (TENORMIN ) 25 MG tablet, Take 1 tablet (25 mg total) by mouth daily., Disp: 90 tablet, Rfl: 3   cyanocobalamin (VITAMIN B12) 1000 MCG tablet, Take 2 tablets (2,000 mcg total) by mouth daily., Disp: 180 tablet, Rfl: 3   EPINEPHrine  0.3 mg/0.3 mL IJ SOAJ injection, Inject 0.3 mg into the muscle as needed., Disp: , Rfl:    escitalopram  (LEXAPRO ) 20 MG tablet, Take 1 tablet (20 mg total) by mouth daily., Disp: 30 tablet, Rfl: 0   hydrOXYzine  (VISTARIL ) 25 MG capsule, Take 1 capsule (25 mg total) by mouth every 8 (eight) hours as needed., Disp: 30 capsule, Rfl: 0   ibuprofen  (ADVIL ) 600 MG tablet, Take 600 mg by mouth as needed., Disp: , Rfl:    levothyroxine  (SYNTHROID ) 200 MCG tablet, TAKE 1 TABLET BY MOUTH DAILY, Disp: 90 tablet, Rfl: 1   LORazepam  (ATIVAN ) 0.5 MG tablet, Take 1 tablet (0.5 mg total) by mouth 2 (two) times daily as needed for anxiety., Disp: 15 tablet, Rfl: 0   omeprazole  (PRILOSEC) 40 MG capsule, Take 1 capsule (40 mg total) by mouth daily., Disp: 90 capsule, Rfl: 3   Vitamin D , Ergocalciferol , (DRISDOL) 1.25 MG (50000 UNIT) CAPS capsule, Take 1 capsule (  50,000 Units total) by mouth every 7 (seven) days., Disp: 12 capsule, Rfl: 1  Observations/Objective: Physical Exam Pulmonary:     Effort: Pulmonary effort is normal.  Neurological:     Mental Status: She is alert and oriented to person, place, and time. Mental status is at baseline.  Psychiatric:        Mood and Affect: Mood normal.        Behavior: Behavior normal.        Thought Content: Thought content normal.        Judgment: Judgment normal.     Assessment and Plan: Mood disorder (HCC) Assessment & Plan: Ongoing anxiety symptoms managed with hydroxyzine  and Ativan . No self-harm ideation. Psychiatry follow-up scheduled. - Continue hydroxyzine  as needed, up to three times daily. Consider two tablets at night for sleep if needed. - Use Ativan  sparingly for severe anxiety. - Follow up with psychiatry on May 29  - Ensure walk-in clinic contact information is accessible. - FMLA forms completed.  Will need further forms completed by PCP   Vitamin D  deficiency Assessment & Plan: Low Vitamin D  Start Vitamin D  1.25 mg weekly for 6 months then over the counter 800iu daily   Vitamin B 12 deficiency Assessment & Plan: Vitamin B 12 low normal Recommend Vitamin B 12 supplement 2000 mcg daily.   Orders: -     Vitamin B-12; Take 2 tablets (2,000 mcg total) by mouth daily.  Dispense: 180 tablet; Refill: 3   Follow Up Instructions: Return if symptoms worsen or fail to improve, for PCP.   I discussed the assessment and treatment plan with the patient. The patient was provided an opportunity to ask questions and all were answered. The patient agreed with the plan and demonstrated an understanding of the instructions.   The patient was advised to call back or seek an in-person evaluation if the symptoms worsen or if the condition fails to improve as anticipated.  The above assessment and management plan was discussed with the patient. The patient verbalized understanding of and has agreed to the management plan. Patient is aware to call the clinic if symptoms persist or worsen. Patient is aware when to return to the clinic for a follow-up visit. Patient educated on when it is appropriate to go to the emergency department.     Valli Gaw, MD

## 2023-08-15 ENCOUNTER — Encounter: Payer: Self-pay | Admitting: Family Medicine

## 2023-08-15 MED ORDER — VITAMIN B-12 1000 MCG PO TABS: 2000.0000 ug | ORAL_TABLET | Freq: Every day | ORAL | 3 refills | Status: DC

## 2023-08-15 NOTE — Assessment & Plan Note (Addendum)
 Ongoing anxiety symptoms managed with hydroxyzine  and Ativan . No self-harm ideation. Psychiatry follow-up scheduled. - Continue hydroxyzine  as needed, up to three times daily. Consider two tablets at night for sleep if needed. - Use Ativan  sparingly for severe anxiety. - Follow up with psychiatry on May 29  - Ensure walk-in clinic contact information is accessible. - FMLA forms completed.  Will need further forms completed by PCP

## 2023-08-15 NOTE — Assessment & Plan Note (Signed)
 Vitamin B 12 low normal Recommend Vitamin B 12 supplement 2000 mcg daily.

## 2023-08-15 NOTE — Assessment & Plan Note (Signed)
 Low Vitamin D  Start Vitamin D  1.25 mg weekly for 6 months then over the counter 800iu daily

## 2023-08-15 NOTE — Patient Instructions (Addendum)
 It was a pleasure meeting you today. Thank you for allowing me to take part in your health care.  Our goals for today as we discussed include:  FMLA forms will be completed and faxed.  Will notify you when faxed Future requests to be completed by PCP  Follow up with Dr Tere Felts as scheduled   This is a list of the screening recommended for you and due dates:  Health Maintenance  Topic Date Due   Pap with HPV screening  Never done   COVID-19 Vaccine (4 - 2024-25 season) 11/21/2022   Flu Shot  10/21/2023   DTaP/Tdap/Td vaccine (5 - Td or Tdap) 01/11/2033   Hepatitis C Screening  Completed   HIV Screening  Completed   HPV Vaccine  Aged Out   Meningitis B Vaccine  Aged Out      If you have any questions or concerns, please do not hesitate to call the office at [phone].  I look forward to our next visit and until then take care and stay safe.  Regards,   Valli Gaw, MD   [facility]

## 2023-08-17 ENCOUNTER — Telehealth: Payer: Self-pay

## 2023-08-17 ENCOUNTER — Other Ambulatory Visit: Payer: Self-pay | Admitting: Nurse Practitioner

## 2023-08-17 DIAGNOSIS — F39 Unspecified mood [affective] disorder: Secondary | ICD-10-CM

## 2023-08-17 NOTE — Telephone Encounter (Signed)
 Called pt and her vm was full unable to leave a message to call the office back.

## 2023-08-18 ENCOUNTER — Encounter: Payer: Self-pay | Admitting: Psychiatry

## 2023-08-18 ENCOUNTER — Ambulatory Visit (INDEPENDENT_AMBULATORY_CARE_PROVIDER_SITE_OTHER): Admitting: Psychiatry

## 2023-08-18 VITALS — BP 118/76 | HR 97 | Temp 97.8°F | Ht 68.0 in | Wt 281.0 lb

## 2023-08-18 DIAGNOSIS — F332 Major depressive disorder, recurrent severe without psychotic features: Secondary | ICD-10-CM | POA: Diagnosis not present

## 2023-08-18 DIAGNOSIS — F411 Generalized anxiety disorder: Secondary | ICD-10-CM | POA: Diagnosis not present

## 2023-08-18 DIAGNOSIS — F439 Reaction to severe stress, unspecified: Secondary | ICD-10-CM

## 2023-08-18 DIAGNOSIS — F331 Major depressive disorder, recurrent, moderate: Secondary | ICD-10-CM | POA: Insufficient documentation

## 2023-08-18 MED ORDER — QUETIAPINE FUMARATE 25 MG PO TABS: 25.0000 mg | ORAL_TABLET | Freq: Every day | ORAL | 0 refills | Status: DC

## 2023-08-18 NOTE — Patient Instructions (Signed)
Quetiapine Tablets What is this medication? QUETIAPINE (kwe TYE a peen) treats schizophrenia and bipolar disorder. It works by balancing the levels of dopamine and serotonin in your brain, hormones that help regulate mood, behaviors, and thoughts. It belongs to a group of medications called antipsychotics. Antipsychotic medications can be used to treat several kinds of mental health conditions. This medicine may be used for other purposes; ask your health care provider or pharmacist if you have questions. COMMON BRAND NAME(S): Seroquel What should I tell my care team before I take this medication? They need to know if you have any of these conditions: Blockage in your bowels Cataracts Constipation Dementia Diabetes Difficulty swallowing Glaucoma Heart disease High levels of prolactin History of breast cancer History of irregular heartbeat Liver disease Low blood cell levels (white cells, red cells, and platelets) Low blood pressure Parkinson disease Prostate disease Seizures Suicidal thoughts, plans, or attempt by you or a family member Thyroid disease Trouble passing urine An unusual or allergic reaction to quetiapine, other medications, foods, dyes, or preservatives Pregnant or trying to get pregnant Breastfeeding How should I use this medication? Take this medication by mouth with water. Take it as directed on the prescription label at the same time every day. You can take it with or without food. If it upsets your stomach, take it with food. Keep taking it unless your care team tells you to stop. A special MedGuide will be given to you by the pharmacist with each prescription and refill. Be sure to read this information carefully each time. Talk to your care team about the use of this medication in children. While this medication may be prescribed for children as young as 10 years for selected conditions, precautions do apply. People over 1 years of age may have a stronger  reaction to this medication and need smaller doses. Overdosage: If you think you have taken too much of this medicine contact a poison control center or emergency room at once. NOTE: This medicine is only for you. Do not share this medicine with others. What if I miss a dose? If you miss a dose, take it as soon as you can. If it is almost time for your next dose, take only that dose. Do not take double or extra doses. What may interact with this medication? Do not take this medication with any of the following: Cisapride Dronedarone Metoclopramide Pimozide Thioridazine This medication may also interact with the following: Alcohol Antihistamines for allergy, cough, and cold Atropine Avasimibe Certain antivirals for HIV or hepatitis Certain medications for anxiety or sleep Certain medications for bladder problems, such as oxybutynin, tolterodine Certain medications for depression, such as amitriptyline, fluoxetine, nefazodone, sertraline Certain medications for fungal infections, such as fluconazole, ketoconazole, itraconazole, posaconazole Certain medications for stomach problems, such as dicyclomine, hyoscyamine Certain medications for travel sickness, such as scopolamine Cimetidine General anesthetics, such as halothane, isoflurane, methoxyflurane, propofol Ipratropium Levodopa or other medications for Parkinson disease Medications for blood pressure Medications for seizures Medications that relax muscles for surgery Opioid medications for pain Other medications that cause heart rhythm changes Phenothiazines, such as chlorpromazine, prochlorperazine Rifampin St. John's wort This list may not describe all possible interactions. Give your health care provider a list of all the medicines, herbs, non-prescription drugs, or dietary supplements you use. Also tell them if you smoke, drink alcohol, or use illegal drugs. Some items may interact with your medicine. What should I watch for  while using this medication? Visit your care team for regular  checks on your progress. Tell your care team if your symptoms do not start to get better or if they get worse. Do not suddenly stop taking This medication. You may develop a severe reaction. Your care team will tell you how much medication to take. If your care team wants you to stop the medication, the dose may be slowly lowered over time to avoid any side effects. You may need to have an eye exam before and during use of this medication. This medication may increase blood sugar. Ask your care team if changes in diet or medications are needed if you have diabetes. This medication may cause thoughts of suicide or depression. This includes sudden changes in mood, behaviors, or thoughts. These changes can happen at any time but are more common in the beginning of treatment or after a change in dose. Call your care team right away if you experience these thoughts or worsening depression. This medication may affect your coordination, reaction time, or judgment. Do not drive or operate machinery until you know how this medication affects you. Sit up or stand slowly to reduce the risk of dizzy or fainting spells. Drinking alcohol with this medication can increase the risk of these side effects. This medication can cause problems with controlling your body temperature. It can lower the response of your body to cold temperatures. If possible, stay indoors during cold weather. If you must go outdoors, wear warm clothes. It can also lower the response of your body to heat. Do not overheat. Do not over-exercise. Stay out of the sun when possible. If you must be in the sun, wear cool clothing. Drink plenty of water. If you have trouble controlling your body temperature, call your care team right away. What side effects may I notice from receiving this medication? Side effects that you should report to your care team as soon as possible: Allergic  reactions--skin rash, itching, hives, swelling of the face, lips, tongue, or throat Heart rhythm changes--fast or irregular heartbeat, dizziness, feeling faint or lightheaded, chest pain, trouble breathing High blood sugar (hyperglycemia)--increased thirst or amount of urine, unusual weakness or fatigue, blurry vision High fever, stiff muscles, increased sweating, fast or irregular heartbeat, and confusion, which may be signs of neuroleptic malignant syndrome High prolactin level--unexpected breast tissue growth, discharge from the nipple, change in sex drive or performance, irregular menstrual cycle Increase in blood pressure in children Infection--fever, chills, cough, or sore throat Low blood pressure--dizziness, feeling faint or lightheaded, blurry vision Low thyroid levels (hypothyroidism)--unusual weakness or fatigue, increased sensitivity to cold, constipation, hair loss, dry skin, weight gain, feelings of depression Pain or trouble swallowing Seizures Stroke--sudden numbness or weakness of the face, arm, or leg, trouble speaking, confusion, trouble walking, loss of balance or coordination, dizziness, severe headache, change in vision Sudden eye pain or change in vision such as blurry vision, seeing halos around lights, vision loss Thoughts of suicide or self-harm, worsening mood, feelings of depression Trouble passing urine Uncontrolled and repetitive body movements, muscle stiffness or spasms, tremors or shaking, loss of balance or coordination, restlessness, shuffling walk, which may be signs of extrapyramidal symptoms (EPS) Side effects that usually do not require medical attention (report to your care team if they continue or are bothersome): Constipation Dizziness Drowsiness Dry mouth Weight gain This list may not describe all possible side effects. Call your doctor for medical advice about side effects. You may report side effects to FDA at 1-800-FDA-1088. Where should I keep my  medication? Keep out  of the reach of children. Store at room temperature between 15 and 30 degrees C (59 and 86 degrees F). Throw away any unused medication after the expiration date. NOTE: This sheet is a summary. It may not cover all possible information. If you have questions about this medicine, talk to your doctor, pharmacist, or health care provider.  2024 [employer] (2021-09-21 00:00:00)
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
## 2023-08-19 ENCOUNTER — Telehealth (HOSPITAL_COMMUNITY): Payer: Self-pay | Admitting: Professional

## 2023-08-22 ENCOUNTER — Telehealth: Payer: Self-pay

## 2023-08-22 NOTE — Telephone Encounter (Signed)
 [employer] form received needing provider to be signed folder.

## 2023-08-23 ENCOUNTER — Ambulatory Visit: Admitting: Nurse Practitioner

## 2023-08-23 VITALS — BP 124/82 | HR 75 | Temp 98.5°F | Ht 68.0 in | Wt 282.6 lb

## 2023-08-23 DIAGNOSIS — Z6841 Body Mass Index (BMI) 40.0 and over, adult: Secondary | ICD-10-CM | POA: Diagnosis not present

## 2023-08-23 DIAGNOSIS — R7303 Prediabetes: Secondary | ICD-10-CM | POA: Diagnosis not present

## 2023-08-23 DIAGNOSIS — F419 Anxiety disorder, unspecified: Secondary | ICD-10-CM

## 2023-08-23 DIAGNOSIS — F32A Depression, unspecified: Secondary | ICD-10-CM | POA: Diagnosis not present

## 2023-08-23 DIAGNOSIS — F39 Unspecified mood [affective] disorder: Secondary | ICD-10-CM | POA: Diagnosis not present

## 2023-08-23 DIAGNOSIS — Z1231 Encounter for screening mammogram for malignant neoplasm of breast: Secondary | ICD-10-CM | POA: Diagnosis not present

## 2023-08-23 DIAGNOSIS — E063 Autoimmune thyroiditis: Secondary | ICD-10-CM

## 2023-08-23 NOTE — Telephone Encounter (Signed)
 Pt seeing PCP today and discuss

## 2023-08-23 NOTE — Telephone Encounter (Signed)
 Form signed and faxed to [phone] with completed transmission log

## 2023-08-23 NOTE — Progress Notes (Unsigned)
 Kelsey Burkitt, NP-C Phone: [phone]  Kelsey Reed is a 43 y.o. female who presents today for follow up.   Discussed the use of AI scribe software for clinical note transcription with the patient, who gave verbal consent to proceed.  History of Present Illness   Kelsey Reed is a 43 year old female who presents for a follow-up on mood management  She recently visited the emergency room due to back pain, initially suspecting a kidney stone, but it was determined not to be the case. During this visit, she experienced heightened anxiety and stress, leading to a recommendation for psychiatric evaluation.  She has since seen a psychiatrist who recommended a partial hospitalization program. She has a consult scheduled for this Friday and a therapist appointment on the 24th of this month. Her medication was changed to Seroquel  25 mg, which is helping with sleep. She is also taking vitamin B12 and vitamin D , which she believes are improving her mood and energy levels.  She has a history of anxiety, which was exacerbated by recent events. She was previously on Lexapro  20 mg but has not taken it since May 29th, after her psychiatrist appointment. She has been taking hydroxyzine  for anxiety, which she finds helpful, but has avoided Ativan . She reports improvement in her mood since starting Seroquel , though she still experiences crying spells and stress related to personal circumstances.  She is actively trying to lose weight, although she finds it challenging due to thyroid  issues. She also has a history of prediabetes and is working on maintaining a healthy diet and exercise routine.  Socially, she is a mother and has been more open with her family about her mental health struggles. She has a supportive family and is trying to engage more in activities, such as visiting her mother and spending time with her grandpup, to keep herself busy and improve her mental state. She is also planning to move to a  more affordable apartment to alleviate financial stress.  She recently had a Pap smear, which returned normal results, and plans to schedule a mammogram locally due to convenience. She has been referred to endocrinology and gastroenterology but has delayed these appointments due to financial constraints.      Social History   Tobacco Use  Smoking Status Former   Current packs/day: 0.00   Average packs/day: 0.3 packs/day for 22.4 years (7.5 ttl pk-yrs)   Types: Cigarettes   Start date: 02/14/2003   Quit date: 02/13/2018   Years since quitting: 5.5  Smokeless Tobacco Never    Current Outpatient Medications on File Prior to Visit  Medication Sig Dispense Refill   albuterol  (VENTOLIN  HFA) 108 (90 Base) MCG/ACT inhaler Inhale 1-2 puffs into the lungs every 6 (six) hours as needed for wheezing or shortness of breath. 8 g 5   cyanocobalamin  (VITAMIN B12) 1000 MCG tablet Take 2 tablets (2,000 mcg total) by mouth daily. (Patient not taking: Reported on 08/18/2023) 180 tablet 3   EPINEPHrine  0.3 mg/0.3 mL IJ SOAJ injection Inject 0.3 mg into the muscle as needed.     escitalopram  (LEXAPRO ) 20 MG tablet Take 1 tablet (20 mg total) by mouth daily. 30 tablet 0   hydrOXYzine  (VISTARIL ) 25 MG capsule Take 1 capsule (25 mg total) by mouth every 8 (eight) hours as needed. 30 capsule 0   ibuprofen  (ADVIL ) 600 MG tablet Take 600 mg by mouth as needed.     levothyroxine  (SYNTHROID ) 200 MCG tablet TAKE 1 TABLET BY MOUTH DAILY 90  tablet 1   LORazepam  (ATIVAN ) 0.5 MG tablet Take 1 tablet (0.5 mg total) by mouth 2 (two) times daily as needed for anxiety. 15 tablet 0   omeprazole  (PRILOSEC) 40 MG capsule Take 1 capsule (40 mg total) by mouth daily. 90 capsule 3   QUEtiapine  (SEROQUEL ) 25 MG tablet Take 1 tablet (25 mg total) by mouth at bedtime. 30 tablet 0   Vitamin D , Ergocalciferol , (DRISDOL ) 1.25 MG (50000 UNIT) CAPS capsule Take 1 capsule (50,000 Units total) by mouth every 7 (seven) days. 12 capsule 1    No current facility-administered medications on file prior to visit.     ROS see history of present illness  Objective  Physical Exam Vitals:   08/23/23 1344  BP: 124/82  Pulse: 75  Temp: 98.5 F (36.9 C)  SpO2: 97%    BP Readings from Last 3 Encounters:  08/23/23 124/82  08/18/23 118/76  08/08/23 136/82   Wt Readings from Last 3 Encounters:  08/23/23 282 lb 9.6 oz (128.2 kg)  08/18/23 281 lb (127.5 kg)  08/10/23 292 lb 2 oz (132.5 kg)    Physical Exam Constitutional:      General: She is not in acute distress.    Appearance: Normal appearance.  HENT:     Head: Normocephalic.  Cardiovascular:     Rate and Rhythm: Normal rate and regular rhythm.     Heart sounds: Normal heart sounds.  Pulmonary:     Effort: Pulmonary effort is normal.     Breath sounds: Normal breath sounds.  Skin:    General: Skin is warm and dry.  Neurological:     General: No focal deficit present.     Mental Status: She is alert.  Psychiatric:        Mood and Affect: Mood normal.        Behavior: Behavior normal.      Assessment/Plan: Please see individual problem list.  Anxiety and depression Assessment & Plan: Anxiety and depression have improved since her ER visit. Partial hospitalization program was recommended. Seroquel  aids sleep, and hydroxyzine  is used for anxiety. Lexapro  20 mg daily has been resumed with mood improvement. She is engaging in therapy with family support and considering therapy for her daughter. Continue Seroquel  25 mg at bedtime and Lexapro  as prescribed. Use hydroxyzine  as needed. Participate in the partial hospitalization program starting Friday. Attend the therapy appointment on June 24. Follow up with psychiatry next month and consider therapy for her daughter. PHQ- 10 and GAD- 9 today. Encouraged to contact if worsening symptoms, unusual behavior changes or suicidal thoughts occur.    Mood disorder [facility]) Assessment & Plan: See plan for anxiety and  depression. Continue Seroquel , Lexapro  and Hydroxyzine . PHP starting Friday. Follow up with Psychiatry and therapist as scheduled.    Prediabetes Assessment & Plan: A1c is in the prediabetic range. Emphasize the importance of a healthy diet and regular exercise.   Hypothyroidism due to Hashimoto thyroiditis Assessment & Plan: Thyroid  levels are normal, but weight loss remains challenging due to the thyroid  condition. Monitor thyroid  function as needed. Continue Levothyroxine .    Morbid obesity with BMI of 40.0-44.9, adult [facility]) Assessment & Plan: Encourage healthy diet and regular exercise.    Screening mammogram for breast cancer -     3D Screening Mammogram, Left and Right; Future     Return in about 5 months (around 01/12/2024) for Annual Exam, sooner as needed.   Kelsey Burkitt, NP-C [facility]

## 2023-08-24 ENCOUNTER — Encounter: Payer: Self-pay | Admitting: Nurse Practitioner

## 2023-08-24 DIAGNOSIS — R7303 Prediabetes: Secondary | ICD-10-CM | POA: Insufficient documentation

## 2023-08-24 NOTE — Assessment & Plan Note (Signed)
 Encourage healthy diet and regular exercise.

## 2023-08-24 NOTE — Assessment & Plan Note (Signed)
 Thyroid  levels are normal, but weight loss remains challenging due to the thyroid  condition. Monitor thyroid  function as needed. Continue Levothyroxine .

## 2023-08-24 NOTE — Assessment & Plan Note (Signed)
 See plan for anxiety and depression. Continue Seroquel , Lexapro  and Hydroxyzine . PHP starting Friday. Follow up with Psychiatry and therapist as scheduled.

## 2023-08-24 NOTE — Assessment & Plan Note (Signed)
 A1c is in the prediabetic range. Emphasize the importance of a healthy diet and regular exercise.

## 2023-08-24 NOTE — Assessment & Plan Note (Addendum)
 Anxiety and depression have improved since her ER visit. Partial hospitalization program was recommended. Seroquel  aids sleep, and hydroxyzine  is used for anxiety. Lexapro  20 mg daily has been resumed with mood improvement. She is engaging in therapy with family support and considering therapy for her daughter. Continue Seroquel  25 mg at bedtime and Lexapro  as prescribed. Use hydroxyzine  as needed. Participate in the partial hospitalization program starting Friday. Attend the therapy appointment on June 24. Follow up with psychiatry next month and consider therapy for her daughter. PHQ- 10 and GAD- 9 today. Encouraged to contact if worsening symptoms, unusual behavior changes or suicidal thoughts occur.

## 2023-08-25 ENCOUNTER — Encounter: Payer: Self-pay | Admitting: Nurse Practitioner

## 2023-08-25 NOTE — Telephone Encounter (Signed)
 Form faxed to [phone] with a completed transmission log

## 2023-08-26 ENCOUNTER — Other Ambulatory Visit (HOSPITAL_COMMUNITY)

## 2023-08-26 ENCOUNTER — Telehealth (HOSPITAL_COMMUNITY): Payer: Self-pay | Admitting: Licensed Clinical Social Worker

## 2023-08-26 ENCOUNTER — Encounter (HOSPITAL_COMMUNITY): Payer: Self-pay

## 2023-09-03 ENCOUNTER — Other Ambulatory Visit: Payer: Self-pay | Admitting: Family Medicine

## 2023-09-03 DIAGNOSIS — F39 Unspecified mood [affective] disorder: Secondary | ICD-10-CM

## 2023-09-12 ENCOUNTER — Other Ambulatory Visit: Payer: Self-pay | Admitting: Psychiatry

## 2023-09-12 DIAGNOSIS — F332 Major depressive disorder, recurrent severe without psychotic features: Secondary | ICD-10-CM

## 2023-09-12 DIAGNOSIS — F411 Generalized anxiety disorder: Secondary | ICD-10-CM

## 2023-09-12 DIAGNOSIS — F439 Reaction to severe stress, unspecified: Secondary | ICD-10-CM

## 2023-09-13 ENCOUNTER — Ambulatory Visit (INDEPENDENT_AMBULATORY_CARE_PROVIDER_SITE_OTHER): Admitting: Clinical

## 2023-09-13 DIAGNOSIS — F331 Major depressive disorder, recurrent, moderate: Secondary | ICD-10-CM | POA: Diagnosis not present

## 2023-09-13 DIAGNOSIS — F411 Generalized anxiety disorder: Secondary | ICD-10-CM

## 2023-09-13 DIAGNOSIS — F439 Reaction to severe stress, unspecified: Secondary | ICD-10-CM

## 2023-09-13 NOTE — Progress Notes (Signed)
   Doree Barthel, LCSW
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
## 2023-09-29 ENCOUNTER — Ambulatory Visit (INDEPENDENT_AMBULATORY_CARE_PROVIDER_SITE_OTHER): Admitting: Clinical

## 2023-09-29 DIAGNOSIS — F331 Major depressive disorder, recurrent, moderate: Secondary | ICD-10-CM | POA: Diagnosis not present

## 2023-09-29 DIAGNOSIS — F439 Reaction to severe stress, unspecified: Secondary | ICD-10-CM | POA: Diagnosis not present

## 2023-09-29 DIAGNOSIS — F411 Generalized anxiety disorder: Secondary | ICD-10-CM

## 2023-09-29 NOTE — Progress Notes (Signed)
   Kelsey Barthel, LCSW

## 2023-09-29 NOTE — Progress Notes (Signed)
 [facility] Counselor/Therapist Progress Note  Patient ID: Kelsey Reed, MRN: [id number],    Date: 09/29/2023  Time Spent: 9:35am - 10:23am : 48 minutes   Treatment Type: Individual Therapy  Reported Symptoms: Patient reported experiencing anxiety and worry while children were in [place]  and reported crying, depressed mood recently  Mental Status Exam: Appearance:  Neat and Well Groomed     Behavior: Appropriate  Motor: Normal  Speech/Language:  Clear and Coherent and Normal Rate  Affect: Appropriate  Mood: normal  Thought process: normal  Thought content:   WNL  Sensory/Perceptual disturbances:   WNL  Orientation: oriented to person, place, time/date, and situation  Attention: Good  Concentration: Good  Memory: WNL  Fund of knowledge:  Good  Insight:   Good  Judgment:  Good  Impulse Control: Good   Risk Assessment: Danger to Self:  No Patient denied current suicidal ideation  Self-injurious Behavior: No Danger to Others: No Patient denied current homicidal ideation Duty to Warn:no Physical Aggression / Violence:No  Access to Firearms a concern: No  Gang Involvement:No   Subjective: Patient stated, it was a rough week with them (children) being gone in response to events since last session. Patient stated, I've been ok in response to mood since last session and stated, theres a lot of stressors coming up, I'm moving this weekend. Patient stated, I feel a bit better than I have been in response to current mood. Patient reported feeling anxious and worry while children were in [place] . Patient stated, I haven't had any of those thoughts in reference to suicidal ideation. Patient stated, I've had some crying and depression. Patient reported no questions regarding diagnoses and stated, I agee with them, all three of them in reference to diagnoses.  Patient stated, the trauma and stress totally affects me still. I want to get help for the trauma, I  want to be able to move forward. Patient stated, Im ready to face it and deal with it and not let it affect so much of my life as it has for so long in reference to trauma. Patient reported a preference for a provider that offers in person sessions and is in the [place], [place] area.   Interventions: Motivational Interviewing. Clinician conducted session in person at clinician's office at [facility]. Reviewed events since last session and assessed for changes. Clinician reviewed diagnoses and treatment recommendations. Provided psycho education related to diagnoses and treatment. Discussed clinician's scope of practice and the importance of connecting patient with a provider that specializes in treatment of trauma. Clinician will initiate a referral to a provider that specializes in treatment of trauma.   Collaboration of Care: Referral or follow-up with counselor/therapist AEB Discussed consent for referral to clinician with expertise in treatment of trauma  Patient/Guardian was advised Release of Information must be obtained prior to any record release in order to collaborate their care with an outside provider. Patient/Guardian was advised if they have not already done so to contact [facility] to sign all necessary forms in order for us  to release information regarding their care.   Consent: Patient/Guardian gives verbal consent for treatment and assignment of benefits for services provided during this visit. Patient/Guardian expressed understanding and agreed to proceed.   Diagnosis:Trauma and stressor-related disorder  Major depressive disorder, recurrent episode, moderate (HCC)  Generalized anxiety disorder  Plan: Clinician will initiate a referral to a provider that specializes in treatment of trauma. Goals/target dates will not be established for this patient  due to patient being referred to a provider that specializes in treatment of trauma.   Darice Seats,  LCSW

## 2023-10-12 ENCOUNTER — Other Ambulatory Visit: Payer: Self-pay

## 2023-10-12 ENCOUNTER — Ambulatory Visit (INDEPENDENT_AMBULATORY_CARE_PROVIDER_SITE_OTHER): Admitting: Psychiatry

## 2023-10-12 ENCOUNTER — Encounter: Payer: Self-pay | Admitting: Psychiatry

## 2023-10-12 VITALS — BP 127/80 | HR 76 | Temp 97.5°F | Ht 68.0 in | Wt 284.8 lb

## 2023-10-12 DIAGNOSIS — F439 Reaction to severe stress, unspecified: Secondary | ICD-10-CM | POA: Diagnosis not present

## 2023-10-12 DIAGNOSIS — Z9189 Other specified personal risk factors, not elsewhere classified: Secondary | ICD-10-CM | POA: Diagnosis not present

## 2023-10-12 DIAGNOSIS — F331 Major depressive disorder, recurrent, moderate: Secondary | ICD-10-CM

## 2023-10-12 DIAGNOSIS — F411 Generalized anxiety disorder: Secondary | ICD-10-CM

## 2023-10-12 MED ORDER — QUETIAPINE FUMARATE 50 MG PO TABS
50.0000 mg | ORAL_TABLET | Freq: Every day | ORAL | 1 refills | Status: DC
  Filled 2023-10-12: qty 30, 30d supply, fill #0
  Filled 2023-11-21: qty 30, 30d supply, fill #1

## 2023-10-12 NOTE — Progress Notes (Unsigned)
 BH MD OP Progress Note  10/12/2023 4:04 PM Kelsey Reed  MRN:  [id number]  Chief Complaint:  Chief Complaint  Patient presents with   Follow-up   Anxiety   Depression   Medication Refill   Discussed the use of AI scribe software for clinical note transcription with the patient, who gave verbal consent to proceed.  History of Present Illness Kelsey Reed is a 42 year old Caucasian female, divorced, lives in [place], employed, has a history of generalized anxiety disorder, major depression, trauma and stress related disorder, vitamin B12 deficiency, vitamin D  deficiency was evaluated in office today for a follow-up appointment.  She experiences ongoing anxiety, particularly in situations involving her children. A recent incident where her children traveled to [place]  without her caused significant anxiety due to concerns about their safety. Additionally, when her daughter went to the beach with her father, she experienced heightened anxiety when unable to contact her daughter for over twelve hours.  She has a history of major depression and generalized anxiety disorder, currently managed with Lexapro  20 mg daily, Seroquel  25 mg at bedtime, and hydroxyzine  25 mg as needed every eight hours. Lorazepam  is also prescribed for anxiety. She notes some improvement in her symptoms since the last visit, attributing this to self-care practices such as journaling and spiritual activities.  She reports sleep disturbances, primarily difficulty staying asleep. She can fall asleep with medication but often wakes up around 3 AM and struggles to return to sleep. No current nightmares are reported.  She has been referred to a therapist specializing in trauma and EMDR therapy but has not yet had an appointment. She recently moved to a more affordable apartment in [place], motivated by financial reasons. 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 Her weight has remained stable since her last visit.  She currently denies any suicidality, homicidality or perceptual disturbances.    Visit Diagnosis:    ICD-10-CM   1. Major depressive disorder, recurrent episode, moderate (HCC)  F33.1 QUEtiapine  (SEROQUEL ) 50 MG tablet    2. Generalized anxiety disorder  F41.1 QUEtiapine  (SEROQUEL ) 50 MG tablet    3. Trauma and stressor-related disorder  F43.9 QUEtiapine  (SEROQUEL ) 50 MG tablet   Unspecified, rule out PTSD    4. At risk for prolonged QT interval syndrome  Z91.89 EKG 12-Lead      Past Psychiatric History: Reviewed past psychiatric history from progress note on 08/18/2023.  Past trials of Lexapro , BuSpar , trazodone .  Past Medical History:  Past Medical History:  Diagnosis Date   Anxiety 2005   Clotting disorder (HCC) 2019   Depression 2007   Family history of breast cancer    Family history of colon cancer    Family history of ovarian cancer    GERD (gastroesophageal reflux disease)    Hashimoto's disease    Hypertension    Moderate mixed hyperlipidemia not requiring statin therapy 01/12/2023   Thyroid  disease    History reviewed. No pertinent surgical history.  Family Psychiatric History: I reviewed family psychiatric history from progress note on 08/18/2023.  Family History:  Family History  Problem Relation Age of Onset   Depression Mother    Anxiety disorder Mother    Hypertension Mother    Hypertension Father    Depression Sister    Anxiety disorder Sister    Breast cancer Paternal Aunt    Cancer Paternal Aunt    Breast cancer Paternal  Aunt    Cancer Paternal Aunt    Breast cancer Paternal Aunt    Cancer Paternal Aunt    Colon cancer Maternal Uncle 66   Hypertension Maternal Grandfather    Ovarian cancer Maternal Grandmother 13   Cancer Maternal Grandmother    Hypertension Maternal Grandmother    Vision loss Maternal Grandmother    Diabetes Paternal Grandfather    Breast cancer Paternal  Grandmother        dx under 11, metastatic recurrence d. 73   Cancer Paternal Grandmother    Bipolar disorder Cousin     Social History: I have reviewed social history from progress note on 08/18/2023. Social History   Socioeconomic History   Marital status: Divorced    Spouse name: Not on file   Number of children: 3   Years of education: Not on file   Highest education level: Associate degree: occupational, Scientist, product/process development, or vocational program  Occupational History   Not on file  Tobacco Use   Smoking status: Former    Current packs/day: 0.00    Average packs/day: 0.3 packs/day for 22.4 years (7.5 ttl pk-yrs)    Types: Cigarettes    Start date: 02/14/2003    Quit date: 02/13/2018    Years since quitting: 5.6   Smokeless tobacco: Never  Vaping Use   Vaping status: Never Used  Substance and Sexual Activity   Alcohol use: Not Currently    Comment: I rarely drink, might be once a month ill have 2   Drug use: Not Currently    Types: Marijuana    Comment: episodic   Sexual activity: Yes    Birth control/protection: Condom, I.U.D.  Other Topics Concern   Not on file  Social History Narrative   Not on file   Social Drivers of Health   Financial Resource Strain: High Risk (08/23/2023)   Overall Financial Resource Strain (CARDIA)    Difficulty of Paying Living Expenses: Very hard  Food Insecurity: Food Insecurity Present (08/23/2023)   Hunger Vital Sign    Worried About Running Out of Food in the Last Year: Often true    Ran Out of Food in the Last Year: Often true  Transportation Needs: No Transportation Needs (08/23/2023)   PRAPARE - Administrator, Civil Service (Medical): No    Lack of Transportation (Non-Medical): No  Physical Activity: Insufficiently Active (08/23/2023)   Exercise Vital Sign    Days of Exercise per Week: 3 days    Minutes of Exercise per Session: 30 min  Stress: Stress Concern Present (08/23/2023)   [employer] of Occupational Health -  Occupational Stress Questionnaire    Feeling of Stress : Rather much  Social Connections: Moderately Isolated (08/23/2023)   Social Connection and Isolation Panel    Frequency of Communication with Friends and Family: More than three times a week    Frequency of Social Gatherings with Friends and Family: Once a week    Attends Religious Services: 1 to 4 times per year    Active Member of [employer] or Organizations: No    Attends Engineer, structural: Not on file    Marital Status: Divorced    Allergies: No Known Allergies  Metabolic Disorder Labs: Lab Results  Component Value Date   HGBA1C 6.1 08/08/2023   No results found for: PROLACTIN Lab Results  Component Value Date   CHOL 178 08/08/2023   TRIG 128.0 08/08/2023   HDL 36.90 (L) 08/08/2023   CHOLHDL 5 08/08/2023  VLDL 25.6 08/08/2023   LDLCALC 115 (H) 08/08/2023   LDLCALC 145 (H) 01/12/2023   Lab Results  Component Value Date   TSH 4.57 08/08/2023   TSH 6.900 (H) 01/12/2023    Therapeutic Level Labs: No results found for: LITHIUM No results found for: VALPROATE No results found for: CBMZ  Current Medications: Current Outpatient Medications  Medication Sig Dispense Refill   QUEtiapine  (SEROQUEL ) 50 MG tablet Take 1 tablet (50 mg total) by mouth at bedtime. 30 tablet 1   albuterol  (VENTOLIN  HFA) 108 (90 Base) MCG/ACT inhaler Inhale 1-2 puffs into the lungs every 6 (six) hours as needed for wheezing or shortness of breath. 8 g 5   cyanocobalamin  (VITAMIN B12) 1000 MCG tablet Take 2 tablets (2,000 mcg total) by mouth daily. (Patient not taking: Reported on 08/18/2023) 180 tablet 3   EPINEPHrine  0.3 mg/0.3 mL IJ SOAJ injection Inject 0.3 mg into the muscle as needed.     escitalopram  (LEXAPRO ) 20 MG tablet TAKE 1 TABLET BY MOUTH DAILY 90 tablet 3   hydrOXYzine  (VISTARIL ) 25 MG capsule Take 1 capsule (25 mg total) by mouth every 8 (eight) hours as needed. 30 capsule 0   ibuprofen  (ADVIL ) 600 MG tablet Take  600 mg by mouth as needed.     levothyroxine  (SYNTHROID ) 200 MCG tablet TAKE 1 TABLET BY MOUTH DAILY 90 tablet 1   LORazepam  (ATIVAN ) 0.5 MG tablet Take 1 tablet (0.5 mg total) by mouth 2 (two) times daily as needed for anxiety. 15 tablet 0   omeprazole  (PRILOSEC) 40 MG capsule Take 1 capsule (40 mg total) by mouth daily. 90 capsule 3   Vitamin D , Ergocalciferol , (DRISDOL ) 1.25 MG (50000 UNIT) CAPS capsule Take 1 capsule (50,000 Units total) by mouth every 7 (seven) days. 12 capsule 1   No current facility-administered medications for this visit.     Musculoskeletal: Strength & Muscle Tone: within normal limits Gait & Station: normal Patient leans: N/A  Psychiatric Specialty Exam: Review of Systems  Psychiatric/Behavioral:  Positive for dysphoric mood. The patient is nervous/anxious.     Blood pressure 127/80, pulse 76, temperature (!) 97.5 F (36.4 C), temperature source Temporal, height 5' 8 (1.727 m), weight 284 lb 12.8 oz (129.2 kg).Body mass index is 43.3 kg/m.  General Appearance: Casual  Eye Contact:  Fair  Speech:  Clear and Coherent  Volume:  Normal  Mood:  Anxious and Depressed improving  Affect:  Congruent  Thought Process:  Goal Directed and Descriptions of Associations: Intact  Orientation:  Full (Time, Place, and Person)  Thought Content: Logical   Suicidal Thoughts:  No  Homicidal Thoughts:  No  Memory:  Immediate;   Fair Recent;   Fair Remote;   Fair  Judgement:  Fair  Insight:  Fair  Psychomotor Activity:  Normal  Concentration:  Concentration: Fair and Attention Span: Fair  Recall:  [employer] of Knowledge: Fair  Language: Fair  Akathisia:  No  Handed:  Right  AIMS (if indicated):  done  Assets:  Communication Skills Desire for Improvement Housing Social Support Transportation  ADL's:  Intact  Cognition: WNL  Sleep:  Fair   Screenings: Geneticist, molecular Office Visit from [facility]12/2023 in [facility]   AIMS Total Score 0   GAD-7    Flowsheet Row Office Visit from 10/12/2023 in [facility] Office Visit from 08/23/2023 in [facility] Visit from 08/18/2023 in  [facility] Office Visit from 08/08/2023 in [facility] Visit from 06/09/2022 in [facility] [employer] [facility]  Total GAD-7 Score 10 7 21 21 18    PHQ2-9    Flowsheet Row Office Visit from 10/12/2023 in [facility] Office Visit from 08/23/2023 in [facility] Visit from 08/18/2023 in [facility] Office Visit from 08/08/2023 in [facility] [employer] Visit from 01/12/2023 in [facility] [employer]  PHQ-2 Total Score 3 3 5 6 2   PHQ-9 Total Score 12 10 21 23 13    Flowsheet Row Office Visit from 10/12/2023 in [facility] Office Visit from 08/18/2023 in [facility] ED from 08/05/2023 in [facility] at [facility]  C-SSRS RISK CATEGORY Moderate Risk Moderate Risk Error: Q3, 4, or 5 should not be populated when Q2 is No     Assessment and Plan: Kelsey Reed is a 43 year old Caucasian female who has a history of depression, anxiety, trauma related symptoms was evaluated in office today, discussed assessment and plan as noted below.  Major depression-improving Generalized anxiety disorder-improving Trauma and stress related disorder-rule out PTSD-improving Currently overall mood symptoms have improved although she continues to have situational anxiety as well as sleep problems.  Tolerating medications well and is compliant on them.  Was able to establish care  with a new therapist and is motivated to start treatment. Continue Lexapro  20 mg daily Increase Seroquel  to 50 mg daily at bedtime Continue Hydroxyzine  25 mg every 8 hours as needed for anxiety Continue Lorazepam  0.5 mg twice a day as needed prescribed by primary care provider currently limiting use. Patient encouraged to start trauma focused therapy and has an appointment with a new therapist.  Will coordinate care.  At risk for prolonged QT syndrome-we will order EKG.  Patient to get it completed a week or 2 after increasing the Seroquel  dosage.   Follow-up Follow-up in clinic in 1 month or sooner if needed.  Collaboration of Care: Collaboration of Care: Referral or follow-up with counselor/therapist AEB encouraged to continue psychotherapy sessions.  Patient/Guardian was advised Release of Information must be obtained prior to any record release in order to collaborate their care with an outside provider. Patient/Guardian was advised if they have not already done so to contact the registration department to sign all necessary forms in order for us  to release information regarding their care.   Consent: Patient/Guardian gives verbal consent for treatment and assignment of benefits for services provided during this visit. Patient/Guardian expressed understanding and agreed to proceed.   This note was generated in part or whole with voice recognition software. Voice recognition is usually quite accurate but there are transcription errors that can and very often do occur. I apologize for any typographical errors that were not detected and corrected.    Adalay Azucena, MD 10/14/2023, 6:31 AM

## 2023-10-12 NOTE — Patient Instructions (Signed)
 Please call for [phone]

## 2023-10-17 ENCOUNTER — Other Ambulatory Visit: Payer: Self-pay | Admitting: Psychiatry

## 2023-10-17 DIAGNOSIS — F332 Major depressive disorder, recurrent severe without psychotic features: Secondary | ICD-10-CM

## 2023-10-17 DIAGNOSIS — F411 Generalized anxiety disorder: Secondary | ICD-10-CM

## 2023-10-17 DIAGNOSIS — F439 Reaction to severe stress, unspecified: Secondary | ICD-10-CM

## 2023-10-19 ENCOUNTER — Other Ambulatory Visit: Payer: Self-pay

## 2023-10-19 DIAGNOSIS — F39 Unspecified mood [affective] disorder: Secondary | ICD-10-CM

## 2023-10-19 MED ORDER — LORAZEPAM 0.5 MG PO TABS
0.5000 mg | ORAL_TABLET | Freq: Two times a day (BID) | ORAL | 0 refills | Status: DC | PRN
  Filled 2023-10-19: qty 15, 8d supply, fill #0

## 2023-10-26 ENCOUNTER — Ambulatory Visit
Admission: RE | Admit: 2023-10-26 | Discharge: 2023-10-26 | Disposition: A | Discharge status: Home / Self Care | Source: Ambulatory Visit | Attending: Nurse Practitioner | Admitting: Nurse Practitioner

## 2023-10-26 DIAGNOSIS — Z1231 Encounter for screening mammogram for malignant neoplasm of breast: Secondary | ICD-10-CM | POA: Diagnosis not present

## 2023-10-31 ENCOUNTER — Ambulatory Visit: Payer: Self-pay | Admitting: Nurse Practitioner

## 2023-11-02 ENCOUNTER — Other Ambulatory Visit: Payer: Self-pay | Admitting: Nurse Practitioner

## 2023-11-02 DIAGNOSIS — E063 Autoimmune thyroiditis: Secondary | ICD-10-CM

## 2023-11-03 ENCOUNTER — Telehealth: Payer: Self-pay

## 2023-11-03 ENCOUNTER — Ambulatory Visit (INDEPENDENT_AMBULATORY_CARE_PROVIDER_SITE_OTHER)

## 2023-11-03 ENCOUNTER — Other Ambulatory Visit

## 2023-11-03 DIAGNOSIS — Z9189 Other specified personal risk factors, not elsewhere classified: Secondary | ICD-10-CM | POA: Diagnosis not present

## 2023-11-03 DIAGNOSIS — Z79899 Other long term (current) drug therapy: Secondary | ICD-10-CM

## 2023-11-03 NOTE — Telephone Encounter (Signed)
 Tried calling pt E2C2 scheduled pt incorrectly was supposed to be on nurse schedule not lab mychart msg sent to pt

## 2023-11-03 NOTE — Progress Notes (Signed)
 EKG preformed on pt  for medication management per orders of Leron Glance.  Pt states no chest pains, palpation, or SOB. EKG reviewed by Leron Glance, and pt was released.

## 2023-11-14 ENCOUNTER — Encounter: Payer: Self-pay | Admitting: Psychiatry

## 2023-11-14 ENCOUNTER — Telehealth: Admitting: Psychiatry

## 2023-11-14 DIAGNOSIS — F4310 Post-traumatic stress disorder, unspecified: Secondary | ICD-10-CM

## 2023-11-14 DIAGNOSIS — F411 Generalized anxiety disorder: Secondary | ICD-10-CM

## 2023-11-14 DIAGNOSIS — F3341 Major depressive disorder, recurrent, in partial remission: Secondary | ICD-10-CM

## 2023-11-14 MED ORDER — VITAMIN B-12 1000 MCG PO TABS: 2000.0000 ug | ORAL_TABLET | Freq: Every day | ORAL | Status: AC

## 2023-11-14 NOTE — Patient Instructions (Signed)
  [email or web address]  [email or web address]  [email or web address] [facility] [address], [place], [place] [address]   [phone]  [facility] [email or web address] [address], [place], [place] [address]  [phone]  [facility] [email or web address] [address] [place], [place] [address]  [phone]   [facility] - [phone]  [facility] - [phone]  [facility] - [phone] counseling [phone]  [facility] [phone]   Medicaid below :  [facility] [address] [place], [place] [address]  [phone]    Belvie Chancy [address] [place], [place] [address]  [phone]    [facility] [address] [place], [place] [address]  [phone]

## 2023-11-14 NOTE — Progress Notes (Signed)
 Virtual Visit via Video Note  I connected with Kelsey Reed on 11/14/23 at  3:40 PM EDT by a video enabled telemedicine application and verified that I am speaking with the correct person using two identifiers.  Location Provider Location : [facility] Patient Location : Work  Participants: Patient , Provider    I discussed the limitations of evaluation and management by telemedicine and the availability of in person appointments. The patient expressed understanding and agreed to proceed.    I discussed the assessment and treatment plan with the patient. The patient was provided an opportunity to ask questions and all were answered. The patient agreed with the plan and demonstrated an understanding of the instructions.   The patient was advised to call back or seek an in-person evaluation if the symptoms worsen or if the condition fails to improve as anticipated.   BH MD OP Progress Note  11/14/2023 4:24 PM Kelsey Reed  MRN:  [id number]  Chief Complaint:  Chief Complaint  Patient presents with   Follow-up   Depression   Anxiety   Medication Refill   Discussed the use of AI scribe software for clinical note transcription with the patient, who gave verbal consent to proceed.  History of Present Illness Kelsey Reed is a 43 year old Caucasian female, divorced, lives in [place], employed, has a history of generalized anxiety disorder, major depression, trauma and stress related disorder, vitamin B12 deficiency, vitamin D  deficiency was evaluated by telemedicine today.  She continues to experience ongoing symptoms related to trauma, depression, and anxiety, with a pattern of good and bad days. She notes that these symptoms sometimes impact her functioning, and she does not feel her overall status has improved compared to a few weeks ago. She reports a persistent sense of being overwhelmed, particularly due to financial stressors, though starting a part-time job at [employer] has helped  her mood by providing additional income and structure. Support from her sister, who recently visited and helped her engage in activities outside her usual routine, has benefited her mood.  She experiences flashbacks and nightmares, but reports improved sleep since she increased her Seroquel  dose to 50 mg. She takes Seroquel  around 7:00 PM when possible, which helps her sleep through the night, though her new work schedule sometimes requires her to take it later, resulting in next-day drowsiness. She continues to take Lexapro  as part of her medication regimen. She has experienced increased anxiety with her daughter's return to school, and she describes heightened worry, but also notes that her daughter's excitement about the new school year has served as a positive distraction.  She denies any thoughts of hurting herself or others.    Visit Diagnosis:    ICD-10-CM   1. Recurrent major depressive disorder, in partial remission (HCC)  F33.41     2. GAD (generalized anxiety disorder)  F41.1     3. PTSD (post-traumatic stress disorder)  F43.10 cyanocobalamin  (VITAMIN B12) 1000 MCG tablet      Past Psychiatric History: I have reviewed past psychiatric history from progress note on 08/18/2023.  Past trials of Lexapro , BuSpar , trazodone .  Past Medical History:  Past Medical History:  Diagnosis Date   Anxiety 2005   Clotting disorder (HCC) 2019   Depression 2007   Family history of breast cancer    Family history of colon cancer    Family history of ovarian cancer    GERD (gastroesophageal reflux disease)    Hashimoto's disease    Hypertension  Moderate mixed hyperlipidemia not requiring statin therapy 01/12/2023   Thyroid  disease    History reviewed. No pertinent surgical history.  Family Psychiatric History: I have reviewed family psychiatric history from progress note on 08/18/2023.  Family History:  Family History  Problem Relation Age of Onset   Depression Mother    Anxiety  disorder Mother    Hypertension Mother    Hypertension Father    Depression Sister    Anxiety disorder Sister    Breast cancer Paternal Aunt    Cancer Paternal Aunt    Breast cancer Paternal Aunt    Cancer Paternal Aunt    Breast cancer Paternal Aunt    Cancer Paternal Aunt    Colon cancer Maternal Uncle 66   Hypertension Maternal Grandfather    Ovarian cancer Maternal Grandmother 51   Cancer Maternal Grandmother    Hypertension Maternal Grandmother    Vision loss Maternal Grandmother    Diabetes Paternal Grandfather    Breast cancer Paternal Grandmother        dx under 20, metastatic recurrence d. 64   Cancer Paternal Grandmother    Bipolar disorder Cousin     Social History: I have reviewed social history from progress note on 08/18/2023. Social History   Socioeconomic History   Marital status: Divorced    Spouse name: Not on file   Number of children: 3   Years of education: Not on file   Highest education level: Associate degree: occupational, Scientist, product/process development, or vocational program  Occupational History   Not on file  Tobacco Use   Smoking status: Former    Current packs/day: 0.00    Average packs/day: 0.3 packs/day for 22.4 years (7.5 ttl pk-yrs)    Types: Cigarettes    Start date: 02/14/2003    Quit date: 02/13/2018    Years since quitting: 5.7   Smokeless tobacco: Never  Vaping Use   Vaping status: Never Used  Substance and Sexual Activity   Alcohol use: Not Currently    Comment: I rarely drink, might be once a month ill have 2   Drug use: Not Currently    Types: Marijuana    Comment: episodic   Sexual activity: Yes    Birth control/protection: Condom, I.U.D.  Other Topics Concern   Not on file  Social History Narrative   Not on file   Social Drivers of Health   Financial Resource Strain: High Risk (08/23/2023)   Overall Financial Resource Strain (CARDIA)    Difficulty of Paying Living Expenses: Very hard  Food Insecurity: Food Insecurity Present  (08/23/2023)   Hunger Vital Sign    Worried About Running Out of Food in the Last Year: Often true    Ran Out of Food in the Last Year: Often true  Transportation Needs: No Transportation Needs (08/23/2023)   PRAPARE - Administrator, Civil Service (Medical): No    Lack of Transportation (Non-Medical): No  Physical Activity: Insufficiently Active (08/23/2023)   Exercise Vital Sign    Days of Exercise per Week: 3 days    Minutes of Exercise per Session: 30 min  Stress: Stress Concern Present (08/23/2023)   [employer] of Occupational Health - Occupational Stress Questionnaire    Feeling of Stress : Rather much  Social Connections: Moderately Isolated (08/23/2023)   Social Connection and Isolation Panel    Frequency of Communication with Friends and Family: More than three times a week    Frequency of Social Gatherings with Friends and Family: Once  a week    Attends Religious Services: 1 to 4 times per year    Active Member of Clubs or Organizations: No    Attends Banker Meetings: Not on file    Marital Status: Divorced    Allergies: No Known Allergies  Metabolic Disorder Labs: Lab Results  Component Value Date   HGBA1C 6.1 08/08/2023   No results found for: PROLACTIN Lab Results  Component Value Date   CHOL 178 08/08/2023   TRIG 128.0 08/08/2023   HDL 36.90 (L) 08/08/2023   CHOLHDL 5 08/08/2023   VLDL 25.6 08/08/2023   LDLCALC 115 (H) 08/08/2023   LDLCALC 145 (H) 01/12/2023   Lab Results  Component Value Date   TSH 4.57 08/08/2023   TSH 6.900 (H) 01/12/2023    Therapeutic Level Labs: No results found for: LITHIUM No results found for: VALPROATE No results found for: CBMZ  Current Medications: Current Outpatient Medications  Medication Sig Dispense Refill   albuterol  (VENTOLIN  HFA) 108 (90 Base) MCG/ACT inhaler Inhale 1-2 puffs into the lungs every 6 (six) hours as needed for wheezing or shortness of breath. 8 g 5   cyanocobalamin   (VITAMIN B12) 1000 MCG tablet Take 2 tablets (2,000 mcg total) by mouth daily.     EPINEPHrine  0.3 mg/0.3 mL IJ SOAJ injection Inject 0.3 mg into the muscle as needed.     escitalopram  (LEXAPRO ) 20 MG tablet TAKE 1 TABLET BY MOUTH DAILY 90 tablet 3   hydrOXYzine  (VISTARIL ) 25 MG capsule Take 1 capsule (25 mg total) by mouth every 8 (eight) hours as needed. 30 capsule 0   ibuprofen  (ADVIL ) 600 MG tablet Take 600 mg by mouth as needed.     levothyroxine  (SYNTHROID ) 200 MCG tablet TAKE 1 TABLET BY MOUTH DAILY 90 tablet 1   LORazepam  (ATIVAN ) 0.5 MG tablet Take 1 tablet (0.5 mg total) by mouth 2 (two) times daily as needed for anxiety. 15 tablet 0   omeprazole  (PRILOSEC) 40 MG capsule Take 1 capsule (40 mg total) by mouth daily. 90 capsule 3   QUEtiapine  (SEROQUEL ) 50 MG tablet Take 1 tablet (50 mg total) by mouth at bedtime. 30 tablet 1   Vitamin D , Ergocalciferol , (DRISDOL ) 1.25 MG (50000 UNIT) CAPS capsule Take 1 capsule (50,000 Units total) by mouth every 7 (seven) days. 12 capsule 1   No current facility-administered medications for this visit.     Musculoskeletal: Strength & Muscle Tone: UTA Gait & Station: Seated Patient leans: N/A  Psychiatric Specialty Exam: Review of Systems  Psychiatric/Behavioral:  Positive for dysphoric mood. The patient is nervous/anxious.     There were no vitals taken for this visit.There is no height or weight on file to calculate BMI.  General Appearance: Casual  Eye Contact:  Fair  Speech:  Normal Rate  Volume:  Normal  Mood:  Anxious and Dysphoric improving  Affect:  Appropriate  Thought Process:  Goal Directed and Descriptions of Associations: Intact  Orientation:  Full (Time, Place, and Person)  Thought Content: Logical   Suicidal Thoughts:  No  Homicidal Thoughts:  No  Memory:  Immediate;   Fair Recent;   Fair Remote;   Fair  Judgement:  Fair  Insight:  Fair  Psychomotor Activity:  Normal  Concentration:  Concentration: Fair and Attention  Span: Fair  Recall:  [employer] of Knowledge: Fair  Language: Fair  Akathisia:  No  Handed:  Right  AIMS (if indicated): not done  Assets:  Communication Skills Desire for Improvement  Housing Social Support Transportation  ADL's:  Intact  Cognition: WNL  Sleep:  Fair   Screenings: Midwife Visit from [facility]12/20[facility] in [facility]  AIMS Total Score 0   GAD-7    Flowsheet Row Office Visit from 10/12/2023 in [facility] Office Visit from 08/23/2023 in [facility] Visit from 08/18/2023 in [facility] Office Visit from 08/08/2023 in [facility] Visit from 06/09/2022 in [facility]  Total GAD-7 Score 10 7 21 21 18    [employer]    Flowsheet Row Office Visit from 10/12/2023 in [facility] Office Visit from 08/23/2023 in [facility] Visit from 08/18/2023 in [facility] Office Visit from 08/08/2023 in [facility] Visit from 01/12/2023 in [facility]  PHQ-2 Total Score 3 3 5 6 2   PHQ-9 Total Score 12 10 21 23 13    Flowsheet Row Video Visit from 11/14/2023 in [facility] Office Visit from 10/12/2023 in [facility] Office Visit from 08/18/2023 in [facility]  C-SSRS RISK CATEGORY Moderate Risk Moderate Risk Moderate Risk     Assessment and Plan: Kelsey Reed is a 43 year old Caucasian female who has a history of depression, anxiety, trauma related symptoms was evaluated by telemedicine today.   Discussed assessment and plan as noted below.  Major depression in partial remission Generalized anxiety disorder-improving PTSD-unstable Currently medications are beneficial and the higher dose of Seroquel  has been helpful in maintaining mood and sleep however continues to have flashbacks intrusive memories about trauma with certain triggers.  Has been unable to establish care with the therapist.  Provided resources in the community Continue Lexapro  20 mg daily Continue Seroquel  50 mg at bedtime Continue hydroxyzine  25 mg every 8 hours as needed for anxiety Continue lorazepam  0.5 mg twice a day as needed prescribed by primary care provider, limiting use Encouraged to establish care with therapist, provided resources in the community.   EKG reviewed dated 11/03/2023-sinus rhythm-[id number].  Patient to continue Seroquel .  Follow-up Follow-up in clinic in 2 to 3 months or sooner if needed.  Collaboration of Care: Collaboration of Care: Referral or follow-up with counselor/therapist AEB  encouraged to establish care with therapist.  Patient/Guardian was advised Release of Information must be obtained prior to any record release in order to collaborate their care with an outside provider. Patient/Guardian was advised if they have not already done so to contact the registration department to sign all necessary forms in order for us  to release information regarding their care.   Consent: Patient/Guardian gives verbal consent for treatment and assignment of benefits for services provided during this visit. Patient/Guardian expressed understanding and agreed to proceed.  This note was generated in part or whole with voice recognition software. Voice recognition is usually quite accurate but there are transcription errors that can and very often do occur. I apologize for any typographical errors that were not detected and corrected.     Shelby Peltz, MD 11/14/2023, 4:24 PM

## 2023-11-24 ENCOUNTER — Other Ambulatory Visit: Payer: Self-pay

## 2023-11-24 ENCOUNTER — Telehealth: Admitting: Physician Assistant

## 2023-11-24 DIAGNOSIS — M549 Dorsalgia, unspecified: Secondary | ICD-10-CM

## 2023-11-24 MED ORDER — CYCLOBENZAPRINE HCL 10 MG PO TABS
5.0000 mg | ORAL_TABLET | Freq: Three times a day (TID) | ORAL | 0 refills | Status: DC | PRN
  Filled 2023-11-24: qty 30, 10d supply, fill #0

## 2023-11-24 MED ORDER — NAPROXEN 500 MG PO TABS
500.0000 mg | ORAL_TABLET | Freq: Two times a day (BID) | ORAL | 0 refills | Status: DC
  Filled 2023-11-24: qty 30, 15d supply, fill #0

## 2023-11-24 NOTE — Progress Notes (Signed)

## 2023-12-01 ENCOUNTER — Other Ambulatory Visit: Payer: Self-pay

## 2023-12-01 MED ORDER — ONDANSETRON HCL 4 MG PO TABS: 4.0000 mg | ORAL_TABLET | Freq: Three times a day (TID) | ORAL | 0 refills | Status: AC | PRN

## 2023-12-01 MED ORDER — BUSPIRONE HCL 7.5 MG PO TABS: 7.5000 mg | ORAL_TABLET | Freq: Two times a day (BID) | ORAL | 1 refills | Status: DC

## 2023-12-01 MED FILL — Escitalopram Oxalate Tab 20 MG (Base Equiv): ORAL | 90 days supply | Qty: 90 | Fill #0 | Status: CN

## 2023-12-01 MED FILL — Levothyroxine Sodium Tab 200 MCG: ORAL | 90 days supply | Qty: 90 | Fill #0 | Status: CN

## 2023-12-02 ENCOUNTER — Other Ambulatory Visit: Payer: Self-pay

## 2023-12-12 ENCOUNTER — Other Ambulatory Visit: Payer: Self-pay

## 2023-12-15 ENCOUNTER — Other Ambulatory Visit: Payer: Self-pay

## 2023-12-15 MED FILL — Levothyroxine Sodium Tab 200 MCG: ORAL | 90 days supply | Qty: 90 | Fill #0 | Status: AC

## 2023-12-22 ENCOUNTER — Other Ambulatory Visit: Payer: Self-pay | Admitting: Psychiatry

## 2023-12-22 DIAGNOSIS — F411 Generalized anxiety disorder: Secondary | ICD-10-CM

## 2023-12-22 DIAGNOSIS — F331 Major depressive disorder, recurrent, moderate: Secondary | ICD-10-CM

## 2023-12-22 DIAGNOSIS — F439 Reaction to severe stress, unspecified: Secondary | ICD-10-CM

## 2023-12-23 ENCOUNTER — Other Ambulatory Visit: Payer: Self-pay

## 2023-12-23 ENCOUNTER — Other Ambulatory Visit: Payer: Self-pay | Admitting: Psychiatry

## 2023-12-23 DIAGNOSIS — F439 Reaction to severe stress, unspecified: Secondary | ICD-10-CM

## 2023-12-23 DIAGNOSIS — F331 Major depressive disorder, recurrent, moderate: Secondary | ICD-10-CM

## 2023-12-23 DIAGNOSIS — F411 Generalized anxiety disorder: Secondary | ICD-10-CM

## 2023-12-23 MED FILL — Quetiapine Fumarate Tab 50 MG: ORAL | 30 days supply | Qty: 30 | Fill #0 | Status: CN

## 2024-01-02 ENCOUNTER — Other Ambulatory Visit: Payer: Self-pay

## 2024-01-04 ENCOUNTER — Other Ambulatory Visit: Payer: Self-pay

## 2024-01-04 MED FILL — Quetiapine Fumarate Tab 50 MG: ORAL | 30 days supply | Qty: 30 | Fill #0 | Status: AC

## 2024-01-12 ENCOUNTER — Other Ambulatory Visit: Payer: Self-pay | Admitting: Nurse Practitioner

## 2024-01-12 ENCOUNTER — Other Ambulatory Visit: Payer: Self-pay

## 2024-01-12 DIAGNOSIS — F39 Unspecified mood [affective] disorder: Secondary | ICD-10-CM

## 2024-01-12 MED ORDER — LORAZEPAM 0.5 MG PO TABS
0.5000 mg | ORAL_TABLET | Freq: Two times a day (BID) | ORAL | 0 refills | Status: AC | PRN
  Filled 2024-01-12 – 2024-01-25 (×2): qty 15, 8d supply, fill #0

## 2024-01-12 MED FILL — Escitalopram Oxalate Tab 20 MG (Base Equiv): ORAL | 90 days supply | Qty: 90 | Fill #0 | Status: AC

## 2024-01-23 ENCOUNTER — Telehealth: Admitting: Psychiatry

## 2024-01-25 ENCOUNTER — Telehealth: Admitting: Physician Assistant

## 2024-01-25 ENCOUNTER — Encounter: Admitting: Nurse Practitioner

## 2024-01-25 ENCOUNTER — Other Ambulatory Visit: Payer: Self-pay

## 2024-01-25 DIAGNOSIS — M545 Low back pain, unspecified: Secondary | ICD-10-CM

## 2024-01-25 MED ORDER — NAPROXEN 500 MG PO TABS
500.0000 mg | ORAL_TABLET | Freq: Two times a day (BID) | ORAL | 0 refills | Status: DC
  Filled 2024-01-25: qty 30, 15d supply, fill #0

## 2024-01-25 MED ORDER — CYCLOBENZAPRINE HCL 10 MG PO TABS
5.0000 mg | ORAL_TABLET | Freq: Three times a day (TID) | ORAL | 0 refills | Status: DC | PRN
  Filled 2024-01-25: qty 30, 10d supply, fill #0

## 2024-01-25 NOTE — Progress Notes (Signed)

## 2024-02-22 MED FILL — Quetiapine Fumarate Tab 50 MG: ORAL | 30 days supply | Qty: 30 | Fill #1 | Status: CN

## 2024-03-03 ENCOUNTER — Other Ambulatory Visit: Payer: Self-pay

## 2024-03-12 ENCOUNTER — Telehealth: Admitting: Physician Assistant

## 2024-03-12 ENCOUNTER — Other Ambulatory Visit: Payer: Self-pay

## 2024-03-12 DIAGNOSIS — M545 Low back pain, unspecified: Secondary | ICD-10-CM | POA: Diagnosis not present

## 2024-03-12 MED ORDER — NAPROXEN 500 MG PO TABS
500.0000 mg | ORAL_TABLET | Freq: Two times a day (BID) | ORAL | 0 refills | Status: AC
  Filled 2024-03-12: qty 20, 10d supply, fill #0

## 2024-03-12 MED ORDER — CYCLOBENZAPRINE HCL 10 MG PO TABS
10.0000 mg | ORAL_TABLET | Freq: Three times a day (TID) | ORAL | 0 refills | Status: AC | PRN
  Filled 2024-03-12: qty 15, 5d supply, fill #0

## 2024-03-12 NOTE — Progress Notes (Signed)
 We are sorry that you are not feeling well.  Here is how we plan to help!  Based on what you have shared with me it, appears you may be experiencing acute back pain.   Acute back pain is defined as musculoskeletal pain that can resolve in 1-3 weeks with conservative treatment.  I have prescribed a non-steroid anti-inflammatory (NSAID) Naprosyn  500 mg -- take one by mouth twice a day, as well as a muscle relaxant, Flexeril  10 mg every eight hours as needed. Some patients experience stomach irritation or in increased heartburn with anti-inflammatory drugs.  Please keep in mind that muscle relaxer's can cause fatigue and should not be taken while at work or driving.  Back pain is very common.  The pain often gets better over time.  The cause of back pain is usually not dangerous.  Most people can learn to manage their back pain on their own.  Home Care Stay active.  Start with short walks on flat ground if you can.  Try to walk farther each day. Do not sit, drive or stand in one place for more than 30 minutes.  Do not stay in bed. Do not fully avoid exercise or work.  Activity can help your back heal faster. Be careful when you bend or lift an object.  Bend at your knees, keep the object close to you, and do not twist. Sleep on a firm mattress.  Lie on your side, and bend your knees.  If you lie on your back, put a pillow under your knees. Only take medicines as told by your doctor. Put ice on the injured area. Put ice in a plastic bag Place a towel between your skin and the bag Leave the ice on for 15-20 minutes, 3-4 times a day for the first 2-3 days.  After that, you can switch between ice and heat packs. Ask your doctor about back exercises or massage.   Get Help Right Way If: Your pain does not go away with rest and treatments given today. Your pain does not go away within 1 week. You have new problems. You do not feel well. The pain spreads into your legs. You cannot control when you  poop (bowel movement) or pee (urinate). You feel sick to your stomach (nauseous) or throw up (vomit). You have belly (abdominal) pain. You feel like you may pass out (faint). If you develop a fever.  Make Sure you: Understand these instructions. Continue to monitor your condition for any changes. Will get help right away if you are not doing well or get worse.  Your e-visit answers were reviewed by a board certified advanced clinical practitioner to complete your personal care plan.  Depending on the condition, your plan could have included both over the counter or prescription medications.  If there is a problem, please reply once you have received a response from your provider.  Your safety is important to us .  If you have drug allergies, check your prescription carefully.    You can use MyChart to ask questions about todays visit, request a non-urgent call back, or ask for a work or school excuse for 24 hours related to this e-Visit. If it has been greater than 24 hours you will need to follow up with your provider or enter a new e-Visit to address those concerns.  You will get an e-mail in the next two days asking about your experience.  I hope that your e-visit has been valuable and will speed your  recovery. Thank you for using e-visits.   I have spent 5 minutes in review of e-visit questionnaire, review and updating patient chart, medical decision making and response to patient.   Elsie Velma Lunger, PA-C
